# Patient Record
Sex: Female | Born: 1950 | Race: White | Hispanic: No | Marital: Married | Smoking: Never smoker
Health system: Southern US, Community
[De-identification: ages and names within clinical notes are randomized; demographics above are authoritative.]

## PROBLEM LIST (undated history)

## (undated) DIAGNOSIS — I10 Essential (primary) hypertension: Secondary | ICD-10-CM

## (undated) DIAGNOSIS — J45909 Unspecified asthma, uncomplicated: Secondary | ICD-10-CM

## (undated) HISTORY — PX: LAMINECTOMY AND MICRODISCECTOMY LUMBAR SPINE: SHX1913

## (undated) HISTORY — PX: ABDOMINAL HYSTERECTOMY: SHX81

## (undated) HISTORY — PX: LAPAROSCOPY: SHX197

## (undated) HISTORY — PX: ANTERIOR FUSION CERVICAL SPINE: SUR626

## (undated) HISTORY — PX: CARPAL TUNNEL RELEASE: SHX101

## (undated) HISTORY — PX: ROTATOR CUFF REPAIR: SHX139

---

## 2013-09-14 ENCOUNTER — Ambulatory Visit: Payer: Self-pay | Admitting: Family Medicine

## 2014-10-04 ENCOUNTER — Other Ambulatory Visit: Payer: Self-pay | Admitting: Family Medicine

## 2014-10-04 DIAGNOSIS — Z1231 Encounter for screening mammogram for malignant neoplasm of breast: Secondary | ICD-10-CM

## 2014-11-07 ENCOUNTER — Ambulatory Visit
Admission: RE | Admit: 2014-11-07 | Discharge: 2014-11-07 | Disposition: A | Discharge status: Home / Self Care | Payer: BLUE CROSS/BLUE SHIELD | Source: Ambulatory Visit | Attending: Family Medicine | Admitting: Family Medicine

## 2014-11-07 DIAGNOSIS — R922 Inconclusive mammogram: Secondary | ICD-10-CM | POA: Insufficient documentation

## 2014-11-07 DIAGNOSIS — Z1231 Encounter for screening mammogram for malignant neoplasm of breast: Secondary | ICD-10-CM | POA: Insufficient documentation

## 2014-11-14 ENCOUNTER — Other Ambulatory Visit: Payer: Self-pay | Admitting: Family Medicine

## 2014-11-14 DIAGNOSIS — N6489 Other specified disorders of breast: Secondary | ICD-10-CM

## 2014-11-27 ENCOUNTER — Ambulatory Visit
Admission: RE | Admit: 2014-11-27 | Discharge: 2014-11-27 | Disposition: A | Discharge status: Home / Self Care | Payer: BLUE CROSS/BLUE SHIELD | Source: Ambulatory Visit | Attending: Family Medicine | Admitting: Family Medicine

## 2014-11-27 DIAGNOSIS — N63 Unspecified lump in breast: Secondary | ICD-10-CM | POA: Diagnosis not present

## 2014-11-27 DIAGNOSIS — N6489 Other specified disorders of breast: Secondary | ICD-10-CM

## 2015-06-16 ENCOUNTER — Other Ambulatory Visit: Payer: Self-pay | Admitting: Family Medicine

## 2015-06-16 DIAGNOSIS — R928 Other abnormal and inconclusive findings on diagnostic imaging of breast: Secondary | ICD-10-CM

## 2015-07-08 ENCOUNTER — Other Ambulatory Visit: Payer: Self-pay | Admitting: Family Medicine

## 2015-07-08 ENCOUNTER — Ambulatory Visit
Admission: RE | Admit: 2015-07-08 | Discharge: 2015-07-08 | Disposition: A | Discharge status: Home / Self Care | Payer: BLUE CROSS/BLUE SHIELD | Source: Ambulatory Visit | Attending: Family Medicine | Admitting: Family Medicine

## 2015-07-08 DIAGNOSIS — N63 Unspecified lump in breast: Secondary | ICD-10-CM | POA: Diagnosis not present

## 2015-07-08 DIAGNOSIS — R928 Other abnormal and inconclusive findings on diagnostic imaging of breast: Secondary | ICD-10-CM

## 2016-08-31 ENCOUNTER — Other Ambulatory Visit: Payer: Self-pay | Admitting: Family Medicine

## 2016-08-31 ENCOUNTER — Ambulatory Visit
Admission: RE | Admit: 2016-08-31 | Discharge: 2016-08-31 | Disposition: A | Discharge status: Home / Self Care | Payer: BLUE CROSS/BLUE SHIELD | Source: Ambulatory Visit | Attending: Family Medicine | Admitting: Family Medicine

## 2016-08-31 DIAGNOSIS — R1031 Right lower quadrant pain: Secondary | ICD-10-CM | POA: Insufficient documentation

## 2016-08-31 DIAGNOSIS — K760 Fatty (change of) liver, not elsewhere classified: Secondary | ICD-10-CM | POA: Insufficient documentation

## 2016-08-31 DIAGNOSIS — K5730 Diverticulosis of large intestine without perforation or abscess without bleeding: Secondary | ICD-10-CM | POA: Insufficient documentation

## 2016-08-31 HISTORY — DX: Essential (primary) hypertension: I10

## 2016-08-31 HISTORY — DX: Unspecified asthma, uncomplicated: J45.909

## 2016-08-31 LAB — POCT I-STAT CREATININE: Creatinine, Ser: 1.3 mg/dL — ABNORMAL HIGH (ref 0.44–1.00)

## 2016-08-31 MED ORDER — IOPAMIDOL (ISOVUE-300) INJECTION 61%
75.0000 mL | Freq: Once | INTRAVENOUS | Status: AC | PRN
  Administered 2016-08-31: 75 mL via INTRAVENOUS

## 2018-10-05 ENCOUNTER — Other Ambulatory Visit: Payer: Self-pay

## 2018-10-05 ENCOUNTER — Ambulatory Visit: Payer: BC Managed Care – PPO | Attending: Gastroenterology

## 2018-10-05 DIAGNOSIS — M62838 Other muscle spasm: Secondary | ICD-10-CM | POA: Insufficient documentation

## 2018-10-05 DIAGNOSIS — R293 Abnormal posture: Secondary | ICD-10-CM | POA: Insufficient documentation

## 2018-10-05 NOTE — Patient Instructions (Signed)
Stabilization: Diaphragmatic Breathing    Lie with knees bent, feet flat. Place one hand on stomach, other on chest. Breathe deeply through nose, lifting belly hand without any motion of hand on chest.  Do this fore at least 5 min. Per night before sleeping and as much as you can remember throughout the day.
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
## 2018-10-12 ENCOUNTER — Other Ambulatory Visit: Payer: Self-pay

## 2018-10-12 ENCOUNTER — Ambulatory Visit: Payer: BC Managed Care – PPO | Attending: Gastroenterology

## 2018-10-12 DIAGNOSIS — R293 Abnormal posture: Secondary | ICD-10-CM | POA: Insufficient documentation

## 2018-10-12 DIAGNOSIS — M62838 Other muscle spasm: Secondary | ICD-10-CM | POA: Diagnosis present
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
## 2018-10-12 NOTE — Patient Instructions (Signed)
3-Way Wall Stretches for Pelvic Floor Lengthening   Bring bottom close to the wall and gently press straighten knees to feel a stretch down the back of you thighs. Hold while taking 5 deep belly breaths and feeling the pelvic floor relax and lower on each inhale.    Let your legs fall to the side to feel a stretch on the inside of your thighs. If the stretch is too intense you can use a pillow to take some of the weight off by wedging it on the outside of your hips. Hold while taking 5 deep belly breaths and feeling the pelvic floor relax and lower on each inhale.    Slide feet down the wall and move hips slightly away from the wall and then let your knees fall to the sides so you feel a stretch on the inside of the thighs near your groin. Hold while taking 5 deep belly breaths and feeling the pelvic floor relax and lower on each inhale.     *Perform each stretch in sequence 3 times, once a day   As you start wearing your heel-lift only wear it for an hour the first day and increase by an hour each day so you can allow for the body to adapt to the change easily without much pain. If your pain increases by more than 1-2 points, back off slightly or slow down how quickly you increase your wear time. Once you reach a full day of wear, use it as much as possible forever, even in house-shoes or flip-flops if necessary to keep yourself from reverting to bad pelvic and spinal alignment and having symptoms return.    Adjust-a-lift heel lift Can be found at [email or web address]   When seated, you want to maintain pelvic neutral with the shoulders gently down and back and ears in line with your shoulders. A lumbar roll such as the one below or a home-made towel-roll can be used for this purpose. Even Olympic athletes can only maintain proper seated posture for about 10 minutes without support!    Pictured: The Original [employer] Lumbar Roll  Use a small foot-stool under your feet to bring your  knees up to hip-height and make sure your lumbar support hits the small of your back, have your bottom all the way back in the chair and lean back onto the backrest.

## 2018-10-19 ENCOUNTER — Ambulatory Visit: Payer: BC Managed Care – PPO

## 2018-10-19 ENCOUNTER — Other Ambulatory Visit: Payer: Self-pay

## 2018-10-19 DIAGNOSIS — R293 Abnormal posture: Secondary | ICD-10-CM

## 2018-10-19 DIAGNOSIS — M62838 Other muscle spasm: Secondary | ICD-10-CM

## 2018-10-19 NOTE — Patient Instructions (Addendum)
   When walking, make sure that you are spending the same amount of time on each leg (and 1, and 2, and 1, and 2) and that you are feeling yourself push off through your toe on both sides. Finally, make sure that your arms are swinging from the motion of your trunk.     Bring both knees up to your chest and then hold the one farthest from the edge of the table/bed and let the other relax toward the floor until stretch is felt through the front of the hip.  Hold for __5__ deep belly breaths. Relax. Repeat __2-3__ times per side.   Do this __1-2__ times per day.

## 2018-10-19 NOTE — Therapy (Signed)
[facility] [address] [place], [place], [address] Phone: [phone]   Fax:  [phone]  Physical Therapy Treatment  The patient has been informed of current processes in place at Outpatient Rehab to protect patients from Covid-19 exposure including social distancing, schedule modifications, and new cleaning procedures. After discussing their particular risk with a therapist based on the patient's personal risk factors, the patient has decided to proceed with in-person therapy.   Patient Details  Name: Mallory Weeks MRN: [id number] Date of Birth: 24-May-1950 No data recorded  Encounter Date: 10/19/2018  PT End of Session - 10/19/18 1355    Visit Number  3    Number of Visits  10    Date for PT Re-Evaluation  12/14/18    Authorization Type  BCBS    Authorization Time Period  through 12/14/2018    Authorization - Visit Number  3    Authorization - Number of Visits  10    PT Start Time  1135    PT Stop Time  1228    PT Time Calculation (min)  53 min    Activity Tolerance  Patient tolerated treatment well;No increased pain    Behavior During Therapy  WFL for tasks assessed/performed       Past Medical History:  Diagnosis Date  . Asthma   . Hypertension     Past Surgical History:  Procedure Laterality Date  . ABDOMINAL HYSTERECTOMY    . ANTERIOR FUSION CERVICAL SPINE    . CARPAL TUNNEL RELEASE Right   . LAMINECTOMY AND MICRODISCECTOMY LUMBAR SPINE    . LAPAROSCOPY Right    knee  . ROTATOR CUFF REPAIR Right     There were no vitals filed for this visit.     Pelvic Floor Physical Therapy Treatment Note  SCREENING  Changes in medications, allergies, or medical history?: none    SUBJECTIVE  Patient reports: Was able to use the urge suppression technique to allow her to wait long enough to get to a store to have a BM and upstairs to her own bathroom for UI many times. Has been doing well with her  stretches and is using her deep breathing more throughout the day as well as kegels.  "I really feel like this is helping"  Precautions:  none  Pain update:  Location of pain: vaginally with intercourse Current pain: 0/10  Max pain: 9/10 Least pain: 0/10 Nature of pain:Sharp and cramping  Patient Goals: Not to be incontinent   OBJECTIVE  Changes in: Posture/Observations:  RLE: 76cm, LLE: 75.5cm  Range of Motion/Flexibilty:  Decreased mobility through all sacral borders, L1-3.  Gait Assessment: Short strides on R>L, narrow BOS, trendelenburg on R, decreased arm-swing and trunk motion.   INTERVENTIONS THIS SESSION: Manual: Performed TP release to B psoas to decrease spasm and pain and allow for improved balance of musculature for improved function and decreased symptoms. Therex: Educated on supine hip-flexor stretch To maintain and improve muscle length and allow for improved balance of musculature for long-term symptom relief. Theract: Educated on gait mechanics and practiced taking longer strides and allowing for thoracic rotation and arm-swing as well as not allowing trendelenburg gait on R.  Total time: 53 min.                             PT Short Term Goals - 10/05/18 1605      PT SHORT TERM  GOAL #1   Title  Patient will demonstrate improved pelvic alignment and balance of musculature surrounding the pelvis to facilitate decreased PFM spasms and decrease pelvic pain.    Baseline  R up-slip and ? RLE long, spasms surrounding pelvis    Time  5    Period  Weeks    Status  New    Target Date  11/09/18      PT SHORT TERM GOAL #2   Title  Patient will report no episodes of UUI/SUI over the course of the prior two weeks to demonstrate improved functional ability.    Baseline  Pt. having UUI and SUI with sneezing > 3 times in a row.    Time  5    Period  Weeks    Status  New    Target Date  11/09/18      PT SHORT TERM GOAL #3   Title   Patient will demonstrate ability to perform self internal TP release in order to facilitate further PFM spasm reduction at home for faster resolution of symptoms    Baseline  Pt. History of pain with intercourse for 10 years suggests that Pt. has PFM spasms leading to PFM dysfunction.    Time  5    Period  Weeks    Status  New    Target Date  11/09/18        PT Long Term Goals - 10/12/18 1402      PT LONG TERM GOAL #1   Title  Patient will score at or below 21/61 on the FISI to demonstrate a clinically meaningful decrease in disability and distress due to pelvic floor dysfunction.    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    Baseline  Pt has had 2 instances of FI over the past two weeks, 3 over the past 6 weeks.    Time  10    Period  Weeks    Status  New    Target Date  12/14/18      PT LONG TERM GOAL #3   Title  Patient will be independent with HEP and stress-management routines in order to return to PLOF.    Baseline  Pt. lacks knowledge of exercises that can decrease her Sx. and is in a state of high stress, contributing to her symptoms.    Time  10    Period  Weeks    Status  New    Target Date  12/14/18            Plan - 10/19/18 1356    Clinical Impression Statement  Pt. has made great improvement with  use of urge-suppression technique for both bowel and bladder issues. She has been able to make it to a bathroom without leakage when having the urge each time though it sometimes takes multiple repetitions of the technique before she gets there. She responded well to all interventions today, demonstrating decreased spasms and improved hip EXT ROM as well as understanding and  correct performance of all education and exercises. Continue per POC.  Personal Factors and Comorbidities  Age;Comorbidity 3+;Time since onset of injury/illness/exacerbation    Comorbidities  abdominal hysterectomy, bladder sling, lumbar laminectomy, cervical fusion, R rotator cuff repair, hypertension and asthma    Examination-Activity Limitations  Continence    Examination-Participation Restrictions  Interpersonal Relationship;Community Activity;Shop    Stability/Clinical Decision Making  Unstable/Unpredictable    Rehab Potential  Good    PT Frequency  1x / week    PT Duration  --   10 weeks   PT Treatment/Interventions  ADLs/Self Care Home Management;Biofeedback;Traction;Moist Heat;Electrical Stimulation;Aquatic Therapy;Gait training;Therapeutic exercise;Therapeutic activities;Functional mobility training;Neuromuscular re-education;Patient/family education;Manual techniques;Scar mobilization;Passive range of motion;Dry needling;Taping;Spinal Manipulations;Joint Manipulations    PT Next Visit Plan  TP release (vs. DN Pt. not showing interest) for adductors. Assess internally when appropriate.    PT Home Exercise Plan  diaphragmatic breathing, 3-way wall stretch and seated posture, hip-flexor stretch, walking mechanics.    Consulted and Agree with Plan of Care  Patient       Patient will benefit from skilled therapeutic intervention in order to improve the following deficits and impairments:  Abnormal gait, Decreased mobility, Hypomobility, Increased muscle spasms, Decreased range of motion, Decreased scar mobility, Improper body mechanics, Obesity, Pain, Postural dysfunction, Impaired flexibility, Increased fascial restricitons, Decreased coordination, Decreased activity tolerance  Visit Diagnosis: 1. Abnormal posture   2. Other muscle spasm        Problem List There are no active problems to display for this patient.  Cleophus MoltKeeli T.  DPT, ATC Cleophus MoltKeeli T  10/19/2018, 2:02  PM  [facility] [address] [place], [place], [address] Phone: [phone]   Fax:  [phone]  Name: Mallory Weeks MRN: [id number] Date of Birth: 1951-01-31

## 2018-10-26 ENCOUNTER — Other Ambulatory Visit: Payer: Self-pay

## 2018-10-26 ENCOUNTER — Ambulatory Visit: Payer: BC Managed Care – PPO

## 2018-10-26 DIAGNOSIS — M62838 Other muscle spasm: Secondary | ICD-10-CM

## 2018-10-26 DIAGNOSIS — R293 Abnormal posture: Secondary | ICD-10-CM

## 2018-10-26 NOTE — Patient Instructions (Signed)
Hip Abduction (Standing)    Stand with support. Squeeze deep core and hold. Start with leg to the side/back on your toe and gently squeeze the outer hip to lift the toe off of the ground. Hold for 1 second then repeat. You should feel this in the outer hip, not the front, not your side,  And not your back. Repeat __10_ times. Do _3__ sets  a day.  HIP Extension - Standing    Stand with support. Squeeze deep core and hold. Start with leg to the back on your toe and gently squeeze the buttock to lift the toe off of the ground. Hold for 1 second then repeat. You should feel this in the outer hip, not the front, not your side,  And not your back. Repeat __10_ times. Do _3__ sets  a day.

## 2018-10-26 NOTE — Therapy (Signed)
[facility] [address] [place], [place], [address] Phone: [phone]   Fax:  [phone]  Physical Therapy Treatment  The patient has been informed of current processes in place at Outpatient Rehab to protect patients from Covid-19 exposure including social distancing, schedule modifications, and new cleaning procedures. After discussing their particular risk with a therapist based on the patient's personal risk factors, the patient has decided to proceed with in-person therapy.   Patient Details  Name: Mallory KlippelSandra Ann Weeks MRN: [id number] Date of Birth: 1950-12-10 No data recorded  Encounter Date: 10/26/2018  PT End of Session - 10/26/18 1017    Visit Number  4    Number of Visits  10    Date for PT Re-Evaluation  12/14/18    Authorization Type  BCBS    Authorization Time Period  through 12/14/2018    Authorization - Visit Number  4    Authorization - Number of Visits  10    PT Start Time  0906    PT Stop Time  1001    PT Time Calculation (min)  55 min    Activity Tolerance  Patient tolerated treatment well;No increased pain    Behavior During Therapy  WFL for tasks assessed/performed       Past Medical History:  Diagnosis Date  . Asthma   . Hypertension     Past Surgical History:  Procedure Laterality Date  . ABDOMINAL HYSTERECTOMY    . ANTERIOR FUSION CERVICAL SPINE    . CARPAL TUNNEL RELEASE Right   . LAMINECTOMY AND MICRODISCECTOMY LUMBAR SPINE    . LAPAROSCOPY Right    knee  . ROTATOR CUFF REPAIR Right     There were no vitals filed for this visit.    [facility] [address] [place], [place], [address] Phone: [phone]   Fax:  [phone]    Pelvic Floor Physical Therapy Treatment Note  SCREENING  Changes in medications, allergies, or medical history?: none    SUBJECTIVE  Patient reports: Is doing well, taking time to pay  attention on her morning walks, no accidents since last visit. Only pain is in R shoulder  Precautions:  none  Pain update:  Location of pain: vaginally with intercourse Current pain: 0/10  Max pain: 9/10 Least pain: 0/10 Nature of pain:Sharp and cramping  Patient Goals: Not to be incontinent   OBJECTIVE  Changes in:  Palpation:  TTP to B adductor brevis, radiates to R lower back.  Gait Assessment: Pt. Continues to have decreased stride length but shows ~ 50% improvement from prior session with gait.   INTERVENTIONS THIS SESSION: Manual: Performed TP release and STM to B adductor brevis to decrease spasm and pain and allow for improved balance of musculature for improved function and decreased symptoms. Therex: Educated on standing hip EXT and ABD to improve strength of muscles opposing tight musculature to allow reciprocal inhibition to improve balance of musculature surrounding the pelvis and improve overall posture for optimal musculature length-tension relationship and function. Dry-needle: Performed TPDN with .30x6990mm needle and standard approach as described below to decrease spasm and pain and allow for improved balance of musculature for improved function and decreased symptoms.  Total time: 55 min.              Trigger Point Dry Needling - 10/26/18 0001    Consent Given?  Yes    Education Handout Provided  No  Muscles Treated Lower Quadrant  Adductor longus/brevis/magnus    Dry Needling Comments  Left    Adductor Response  Twitch response elicited;Palpable increased muscle length             PT Short Term Goals - 10/05/18 1605      PT SHORT TERM GOAL #1   Title  Patient will demonstrate improved pelvic alignment and balance of musculature surrounding the pelvis to facilitate decreased PFM spasms and decrease pelvic pain.    Baseline  R up-slip and ? RLE long, spasms surrounding pelvis    Time  5    Period  Weeks    Status  New     Target Date  11/09/18      PT SHORT TERM GOAL #2   Title  Patient will report no episodes of UUI/SUI over the course of the prior two weeks to demonstrate improved functional ability.    Baseline  Pt. having UUI and SUI with sneezing > 3 times in a row.    Time  5    Period  Weeks    Status  New    Target Date  11/09/18      PT SHORT TERM GOAL #3   Title  Patient will demonstrate ability to perform self internal TP release in order to facilitate further PFM spasm reduction at home for faster resolution of symptoms    Baseline  Pt. History of pain with intercourse for 10 years suggests that Pt. has PFM spasms leading to PFM dysfunction.    Time  5    Period  Weeks    Status  New    Target Date  11/09/18        PT Long Term Goals - 10/12/18 1402      PT LONG TERM GOAL #1   Title  Patient will score at or below 21/61 on the FISI to demonstrate a clinically meaningful decrease in disability and distress due to pelvic floor dysfunction.    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    Baseline  Pt has had 2 instances of FI over the past two weeks, 3 over the past 6 weeks.    Time  10    Period  Weeks    Status  New    Target Date  12/14/18      PT LONG TERM GOAL #3   Title  Patient will be independent with HEP and stress-management routines in order to return to PLOF.    Baseline  Pt. lacks knowledge of exercises that can decrease her Sx. and is in a state of high stress, contributing to her symptoms.    Time  10    Period  Weeks    Status  New    Target Date  12/14/18            Plan - 10/26/18 1017    Clinical Impression Statement  Pt. Responded well to all interventions today,  demonstrating improved hip ER/ABD ROM in supine, decreased spasm and TTP at adductors, as well as understanding and correct performance  of all education and exercises provided today. They will continue to benefit from skilled physical therapy to work toward remaining goals and maximize function as well as decrease likelihood of symptom increase or recurrence.     Personal Factors and Comorbidities  Age;Comorbidity 3+;Time since onset of injury/illness/exacerbation    Comorbidities  abdominal hysterectomy, bladder sling, lumbar laminectomy, cervical fusion, R rotator cuff repair, hypertension and asthma    Examination-Activity Limitations  Continence    Examination-Participation Restrictions  Interpersonal Relationship;Community Activity;Shop    Stability/Clinical Decision Making  Unstable/Unpredictable    Rehab Potential  Good    PT Frequency  1x / week    PT Duration  --   10 weeks   PT Treatment/Interventions  ADLs/Self Care Home Management;Biofeedback;Traction;Moist Heat;Electrical Stimulation;Aquatic Therapy;Gait training;Therapeutic exercise;Therapeutic activities;Functional mobility training;Neuromuscular re-education;Patient/family education;Manual techniques;Scar mobilization;Passive range of motion;Dry needling;Taping;Spinal Manipulations;Joint Manipulations    PT Next Visit Plan  Assess internally when appropriate.    PT Home Exercise Plan  diaphragmatic breathing, 3-way wall stretch and seated posture, hip-flexor stretch, walking mechanics, butterfly stretch in supine? (pt. says she has handout) and standing hip EXXT and ABD    Consulted and Agree with Plan of Care  Patient       Patient will benefit from skilled therapeutic intervention in order to improve the following deficits and impairments:  Abnormal gait, Decreased mobility, Hypomobility, Increased muscle spasms, Decreased range of motion, Decreased scar mobility, Improper body mechanics, Obesity, Pain, Postural dysfunction,  Impaired flexibility, Increased fascial restricitons, Decreased coordination, Decreased activity tolerance  Visit Diagnosis: 1. Abnormal posture   2. Other muscle spasm        Problem List There are no active problems to display for this patient.  Willa Rough DPT, ATC Willa Rough 10/26/2018, 10:21 AM  [place] MAIN [facility] SERVICES [address] [place], [place], [address] Phone: [phone]   Fax:  [phone]  Name: Marya Lowden MRN: [id number] Date of Birth: 05-12-1950

## 2018-11-02 ENCOUNTER — Ambulatory Visit: Payer: BC Managed Care – PPO

## 2018-11-02 ENCOUNTER — Other Ambulatory Visit: Payer: Self-pay

## 2018-11-02 DIAGNOSIS — R293 Abnormal posture: Secondary | ICD-10-CM

## 2018-11-02 DIAGNOSIS — M62838 Other muscle spasm: Secondary | ICD-10-CM

## 2018-11-02 NOTE — Patient Instructions (Addendum)
Cat / Cow Flow    Breathe in, let belly relax down toward the floor and then breathe out, pulling the lower belly in toward the backbone.   Repeat this _10x3__ times _1-3__ times per day  Intimate Rose internal trigger point release tool [phone]  Dr. Kathline Magic Premium Prostate Massager   $19.99 (at [facility] for 29.99)   Self Internal Trigger Point Relief    1) Wash your hands and prop yourself up in a way where you can easily reach the vagina. You may wish to have a small hand-held mirror near by.  2) lubricate the tool and vaginal opening using a hypoallergenic lubricant such as "slippery-stuff".   3) Slowly and gently insert the tool into the vagina using deep breaths to allow relaxation of the muscles around the tool.  4) Avoiding the "12 o-clock" region near the urethra, gently use the handle of the tool like a lever to press the angled tip of the tool onto the wall of the pelvic floor.   5) Move the tool to different areas of the pelvic floor and feel for areas that are tender called "trigger points". When you find one hold the tool still, applying just enough pressure to elicit mild discomfort and take deep belly breaths until the discomfort subsides or decreases by at least 50%.   6) Repeat the process for any trigger points you find spending between 3-10 minutes on this per night until you do not find any more trigger points or you are told otherwise by your therapist..  Self External Trigger Point Relief    1) Wash your hands and prop yourself up in a way where you can easily reach the vagina. You may wish to have a small hand-held mirror near by.  2) Use the 2 middle fingers to put gentle pressure on the three external pelvic floor muscles and hold pressure and take deep breaths as you allow the tension to release and discomfort to dissipate   3) Repeat the process for any trigger points you find spending between 3-10 minutes on this every 1-2 days  until you do not find any more trigger points or you are told otherwise by your therapist.

## 2018-11-02 NOTE — Therapy (Signed)
[facility] MAIN [facility] SERVICES [address] [place], [place], [address] Phone: [phone]   Fax:  [phone]  Physical Therapy Treatment  The patient has been informed of current processes in place at Outpatient Rehab to protect patients from Covid-19 exposure including social distancing, schedule modifications, and new cleaning procedures. After discussing their particular risk with a therapist based on the patient's personal risk factors, the patient has decided to proceed with in-person therapy.   Patient Details  Name: Mallory Weeks MRN: [id number] Date of Birth: [birth year] No data recorded  Encounter Date: 11/02/2018  PT End of Session - 11/02/18 1026    Visit Number  5    Number of Visits  10    Date for PT Re-Evaluation  12/14/18    Authorization Type  [employer]    Authorization Time Period  through 12/14/2018    Authorization - Visit Number  5    Authorization - Number of Visits  10    PT Start Time  0907    PT Stop Time  1000    PT Time Calculation (min)  53 min    Activity Tolerance  Patient tolerated treatment well;No increased pain    Behavior During Therapy  WFL for tasks assessed/performed       Past Medical History:  Diagnosis Date  . Asthma   . Hypertension     Past Surgical History:  Procedure Laterality Date  . ABDOMINAL HYSTERECTOMY    . ANTERIOR FUSION CERVICAL SPINE    . CARPAL TUNNEL RELEASE Right   . LAMINECTOMY AND MICRODISCECTOMY LUMBAR SPINE    . LAPAROSCOPY Right    knee  . ROTATOR CUFF REPAIR Right     There were no vitals filed for this visit.    Pelvic Floor Physical Therapy Treatment Note  SCREENING  Changes in medications, allergies, or medical history?: none    SUBJECTIVE  Patient reports: She is taking longer strides when she walks, not getting tired out as fast, the urge to go to the bathroom is not as strong. No leakage over the past two weeks. Hates intercourse not just because of  pain but because of being hot when he is near her.  Precautions:  none  Pain update:  Location of pain: vaginally with intercourse Current pain: 0/10  Max pain: 9/10 Least pain: 0/10 Nature of pain:Sharp and cramping  Patient Goals: Not to be incontinent   OBJECTIVE  Changes in:  Abdominal:  Able to contract TA with min. VC,TC for TA in quad exercise  Pelvic Floor External Exam: Introitus Appears: elevated, accessory glutes,adductors Skin integrity: WNL Palpation: TTP throughout Cough: parodoxical Prolapse visible?: no Scar mobility: scarring internally with decreased mobility but not assessed externally  Internal Vaginal Exam: Strength (PERF): 4/5, 6 sec, 2 times Symmetry: greater scarring posteriorly on R Palpation: highly tender throughout Prolapse: none   Internal Rectal Exam: Not assessed Strength (PERF): Symmetry: Palpation: Prolapse:    Gait Assessment: Improved stride length by ~ 65%   INTERVENTIONS THIS SESSION: Manual: assessed PFM and performed TP release to R posterior PR to decrease spasm and help Pt. Understand how to perform self TP release to decrease spasm and pain and allow for improved balance of musculature for improved function and decreased symptoms. Therex: Educated on and practiced TA in quadruped to improve deep-core recruitment and coordination for long-term relief of Sx. Self-care: Educated on the importance of addressing her stress and anxiety for her overall wellness as well as  her pelvic health. Educated on where to find tool for self TP release and how the physical spasms she is having impacts her overall sex drive and enjoyment. Total time: 55 min.                           PT Short Term Goals - 10/05/18 1605      PT SHORT TERM GOAL #1   Title  Patient will demonstrate improved pelvic alignment and balance of musculature surrounding the pelvis to facilitate decreased PFM spasms and decrease  pelvic pain.    Baseline  R up-slip and ? RLE long, spasms surrounding pelvis    Time  5    Period  Weeks    Status  New    Target Date  11/09/18      PT SHORT TERM GOAL #2   Title  Patient will report no episodes of UUI/SUI over the course of the prior two weeks to demonstrate improved functional ability.    Baseline  Pt. having UUI and SUI with sneezing > 3 times in a row.    Time  5    Period  Weeks    Status  New    Target Date  11/09/18      PT SHORT TERM GOAL #3   Title  Patient will demonstrate ability to perform self internal TP release in order to facilitate further PFM spasm reduction at home for faster resolution of symptoms    Baseline  Pt. History of pain with intercourse for 10 years suggests that Pt. has PFM spasms leading to PFM dysfunction.    Time  5    Period  Weeks    Status  New    Target Date  11/09/18        PT Long Term Goals - 10/12/18 1402      PT LONG TERM GOAL #1   Title  Patient will score at or below 21/61 on the FISI to demonstrate a clinically meaningful decrease in disability and distress due to pelvic floor dysfunction.    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    Baseline  Pt has had 2 instances of FI over the past two weeks, 3 over the past 6 weeks.    Time  10    Period  Weeks    Status  New    Target Date  12/14/18      PT LONG TERM GOAL #3   Title  Patient will be independent with HEP and stress-management routines in order to return to PLOF.    Baseline  Pt. lacks knowledge of exercises that can decrease her Sx. and is in a state of high stress, contributing to her symptoms.    Time  10    Period  Weeks    Status  New    Target Date   12/14/18            Plan - 11/02/18 1027    Clinical Impression Statement  Pt. Responded well to all interventions today, demonstrating  decreased resting PFM tension, improved ROM and decreased spasm as well as understanding and correct performance of all education and exercises provided today. They will continue to benefit from skilled physical therapy to work toward remaining goals and maximize function as well as decrease likelihood of symptom increase or recurrence.     Personal Factors and Comorbidities  Age;Comorbidity 3+;Time since onset of injury/illness/exacerbation    Comorbidities  abdominal hysterectomy, bladder sling, lumbar laminectomy, cervical fusion, R rotator cuff repair, hypertension and asthma    Examination-Activity Limitations  Continence    Examination-Participation Restrictions  Interpersonal Relationship;Community Activity;Shop    Stability/Clinical Decision Making  Unstable/Unpredictable    Rehab Potential  Good    PT Frequency  1x / week    PT Duration  --   10 weeks   PT Treatment/Interventions  ADLs/Self Care Home Management;Biofeedback;Traction;Moist Heat;Electrical Stimulation;Aquatic Therapy;Gait training;Therapeutic exercise;Therapeutic activities;Functional mobility training;Neuromuscular re-education;Patient/family education;Manual techniques;Scar mobilization;Passive range of motion;Dry needling;Taping;Spinal Manipulations;Joint Manipulations    PT Next Visit Plan  Perform internal or external TP release/scar release or teach Pt. how to do self release with tool PRN, give bow-and-arrow rotations and    PT Home Exercise Plan  diaphragmatic breathing, 3-way wall stretch and seated posture, hip-flexor stretch, walking mechanics, butterfly stretch in supine? (pt. says she has handout) and standing hip EXXT and ABD    Consulted and Agree with Plan of Care  Patient       Patient will benefit from skilled therapeutic intervention in order to improve the  following deficits and impairments:  Abnormal gait, Decreased mobility, Hypomobility, Increased muscle spasms, Decreased range of motion, Decreased scar mobility, Improper body mechanics, Obesity, Pain, Postural dysfunction, Impaired flexibility, Increased fascial restricitons, Decreased coordination, Decreased activity tolerance  Visit Diagnosis: Abnormal posture  Other muscle spasm     Problem List There are no active problems to display for this patient.  Cleophus MoltKeeli T.  DPT, ATC Cleophus MoltKeeli T  11/02/2018, 10:30 AM   [facility] [address] [place], [place], [address] Phone: [phone]   Fax:  [phone]  Name: Mallory Weeks MRN: [id number] Date of Birth: 27-Apr-1950

## 2018-11-09 ENCOUNTER — Ambulatory Visit: Payer: BC Managed Care – PPO | Attending: Gastroenterology

## 2018-11-09 ENCOUNTER — Other Ambulatory Visit: Payer: Self-pay

## 2018-11-09 DIAGNOSIS — M62838 Other muscle spasm: Secondary | ICD-10-CM | POA: Diagnosis present

## 2018-11-09 DIAGNOSIS — R293 Abnormal posture: Secondary | ICD-10-CM | POA: Insufficient documentation

## 2018-11-09 NOTE — Therapy (Signed)
[place] [facility] [address] [place], [place], [address] Phone: [phone]   Fax:  [phone]  Physical Therapy Treatment  The patient has been informed of current processes in place at Outpatient Rehab to protect patients from Covid-19 exposure including social distancing, schedule modifications, and new cleaning procedures. After discussing their particular risk with a therapist based on the patient's personal risk factors, the patient has decided to proceed with in-person therapy.   Patient Details  Name: Mallory KlippelSandra Ann Weeks MRN: [id number] Date of Birth: 1950-10-02 No data recorded  Encounter Date: 11/09/2018  PT End of Session - 11/09/18 1507    Visit Number  6    Number of Visits  10    Date for PT Re-Evaluation  12/14/18    Authorization Type  [employer]    Authorization Time Period  through 12/14/2018    Authorization - Visit Number  6    Authorization - Number of Visits  10    PT Start Time  1137    PT Stop Time  1230    PT Time Calculation (min)  53 min    Activity Tolerance  Patient tolerated treatment well;No increased pain    Behavior During Therapy  WFL for tasks assessed/performed       Past Medical History:  Diagnosis Date  . Asthma   . Hypertension     Past Surgical History:  Procedure Laterality Date  . ABDOMINAL HYSTERECTOMY    . ANTERIOR FUSION CERVICAL SPINE    . CARPAL TUNNEL RELEASE Right   . LAMINECTOMY AND MICRODISCECTOMY LUMBAR SPINE    . LAPAROSCOPY Right    knee  . ROTATOR CUFF REPAIR Right     There were no vitals filed for this visit.    Pelvic Floor Physical Therapy Treatment Note  SCREENING  Changes in medications, allergies, or medical history?: none    SUBJECTIVE  Patient reports: Woke up on Friday with R knee pain which worsened when she took her walk, cutting it short. 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 Still doing well with bowel/bladder Sx. Was going to try  intercourse but Partner lost interest when she said they would have to be careful for her knee.  Precautions:  none  Pain update:  Location of pain: vaginally with intercourse (R knee) Current pain: 0/10 (3) Max pain: 9/10 (8) Least pain: 0/10 (3) Nature of pain:Sharp and cramping (achy, stiff, throbbing)  Patient Goals: Not to be incontinent   OBJECTIVE  Changes in:  Observation/posture: R up-slip  Palpation: TTP to R gracilis and semimembranosus with pain referring to medial tibial flare.    INTERVENTIONS THIS SESSION: Manual: Performed TP release to R gracilis and semimembranosus followed by r up-slip correction to decrease spasm and mal-alignment and allow for Pt. To return to walking program with improved gait pattern and decreased pain. Therex: educated on and practiced side-stretch To maintain and improve muscle length and allow for improved balance of musculature for long-term symptom relief.   Total time: 53 min.                             PT Short Term Goals - 10/05/18 1605      PT SHORT TERM GOAL #1   Title  Patient will demonstrate improved pelvic alignment and balance of musculature surrounding the pelvis to facilitate decreased PFM spasms and decrease pelvic pain.    Baseline  R up-slip and ? RLE long, spasms surrounding pelvis    Time  5    Period  Weeks    Status  New    Target Date  11/09/18      PT SHORT TERM GOAL #2   Title  Patient will report no episodes of UUI/SUI over the course of the prior two Weeks to demonstrate improved functional ability.    Baseline  Pt. having UUI and SUI with sneezing > 3 times in a row.    Time  5    Period  Weeks    Status  New    Target Date  11/09/18      PT SHORT TERM GOAL #3   Title  Patient will demonstrate ability to perform self internal TP release in order to facilitate further PFM spasm reduction at home for faster resolution of symptoms    Baseline  Pt. History of pain  with intercourse for 10 years suggests that Pt. has PFM spasms leading to PFM dysfunction.    Time  5    Period  Weeks    Status  New    Target Date  11/09/18        PT Long Term Goals - 10/12/18 1402      PT LONG TERM GOAL #1   Title  Patient will score at or below 21/61 on the FISI to demonstrate a clinically meaningful decrease in disability and distress due to pelvic floor dysfunction.    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    Baseline  Pt has had 2 instances of FI over the past two Weeks, 3 over the past 6 Weeks.    Time  10    Period  Weeks    Status  New    Target Date  12/14/18      PT LONG TERM GOAL #3   Title  Patient will be independent with HEP and stress-management routines in order to return to PLOF.    Baseline  Pt. lacks knowledge of exercises that can decrease her Sx. and is in a state of high stress, contributing to her symptoms.    Time  10    Period  Weeks    Status  New    Target Date  12/14/18            Plan - 11/09/18 1507    Clinical Impression Statement  Pt. Responded well to all interventions today, demonstrating improved pain from 3/10 to 0/10 and return to her normal gait mechanics as well as understanding and correct performance of all education and exercises provided today. They will continue to benefit from skilled physical therapy to work toward remaining goals and maximize function as well as decrease likelihood of symptom increase or recurrence.     Personal Factors and Comorbidities  Age;Comorbidity 3+;Time since onset of injury/illness/exacerbation    Comorbidities  abdominal hysterectomy, bladder sling, lumbar laminectomy, cervical fusion,  R rotator cuff repair, hypertension and asthma    Examination-Activity Limitations  Continence    Examination-Participation Restrictions  Interpersonal Relationship;Community Activity;Shop  Stability/Clinical Decision Making  Unstable/Unpredictable    Rehab Potential  Good    PT Frequency  1x / week    PT Duration  --   10 Weeks   PT Treatment/Interventions  ADLs/Self Care Home Management;Biofeedback;Traction;Moist Heat;Electrical Stimulation;Aquatic Therapy;Gait training;Therapeutic exercise;Therapeutic activities;Functional mobility training;Neuromuscular re-education;Patient/family education;Manual techniques;Scar mobilization;Passive range of motion;Dry needling;Taping;Spinal Manipulations;Joint Manipulations    PT Next Visit Plan  Perform internal or external TP release/scar release or teach Pt. how to do self release with tool PRN, give bow-and-arrow rotations and chin-tucks/scap retraction    PT Home Exercise Plan  diaphragmatic breathing, 3-way wall stretch and seated posture, hip-flexor stretch, walking mechanics, butterfly stretch in supine? (pt. says she has handout) and standing hip EXXT and ABD    Consulted and Agree with Plan of Care  Patient       Patient will benefit from skilled therapeutic intervention in order to improve the following deficits and impairments:  Abnormal gait, Decreased mobility, Hypomobility, Increased muscle spasms, Decreased range of motion, Decreased scar mobility, Improper body mechanics, Obesity, Pain, Postural dysfunction, Impaired flexibility, Increased fascial restricitons, Decreased coordination, Decreased activity tolerance  Visit Diagnosis: Abnormal posture  Other muscle spasm     Problem List There are no active problems to display for this patient.  Cleophus Molt DPT, ATC Cleophus Molt 11/09/2018, 3:09 PM  [place] [facility] MAIN [facility] SERVICES [address] [place], [place], [address] Phone:  [phone]   Fax:  [phone]  Name: Mallory Weeks MRN: [id number] Date of Birth: February 11, 1951

## 2018-11-09 NOTE — Patient Instructions (Addendum)
Int[employer] Rose internal trigger point release tool [id number]  Dr. Kathline Magic Premium Prostate Massager   [id number]   Hold for 30 seconds (5 deep breaths) and repeat 2-3 times on each side once a day

## 2018-11-16 ENCOUNTER — Other Ambulatory Visit: Payer: Self-pay

## 2018-11-16 ENCOUNTER — Ambulatory Visit: Payer: BC Managed Care – PPO

## 2018-11-16 DIAGNOSIS — M62838 Other muscle spasm: Secondary | ICD-10-CM

## 2018-11-16 DIAGNOSIS — R293 Abnormal posture: Secondary | ICD-10-CM | POA: Diagnosis not present

## 2018-11-16 NOTE — Therapy (Addendum)
[place] [facility] MAIN [facility] SERVICES [address] [place], [place], [address] Phone: [phone]   Fax:  [phone]  Physical Therapy Treatment  The patient has been informed of current processes in place at Outpatient Rehab to protect patients from Covid-19 exposure including social distancing, schedule modifications, and new cleaning procedures. After discussing their particular risk with a therapist based on the patient's personal risk factors, the patient has decided to proceed with in-person therapy.  Patient Details  Name: Mallory Weeks MRN: [id number] Date of Birth: April 26, 1950 No data recorded  Encounter Date: 11/16/2018  PT End of Session - 11/20/18 1010    Visit Number  7    Number of Visits  10    Date for PT Re-Evaluation  12/14/18    Authorization Type  BCBS    Authorization Time Period  through 12/14/2018    Authorization - Visit Number  7    Authorization - Number of Visits  10    PT Start Time  1130    PT Stop Time  1230    PT Time Calculation (min)  60 min    Activity Tolerance  Patient tolerated treatment well;No increased pain    Behavior During Therapy  WFL for tasks assessed/performed       Past Medical History:  Diagnosis Date  . Asthma   . Hypertension     Past Surgical History:  Procedure Laterality Date  . ABDOMINAL HYSTERECTOMY    . ANTERIOR FUSION CERVICAL SPINE    . CARPAL TUNNEL RELEASE Right   . LAMINECTOMY AND MICRODISCECTOMY LUMBAR SPINE    . LAPAROSCOPY Right    knee  . ROTATOR CUFF REPAIR Right     There were no vitals filed for this visit.   Pelvic Floor Physical Therapy Treatment Note  SCREENING  Changes in medications, allergies, or medical history?: none    SUBJECTIVE  Patient reports: Has R knee pain, had a bowel episode where she had minimal leakage with runny stool and while dealing with her two grandkids. Has been having R knee pain that feels like when she had to have a knee  arthroscopy before.  Precautions:  none  Pain update:  Location of pain: vaginally with intercourse (R knee) Current pain: 0/10 (6) Max pain: 9/10 (8) Least pain: 0/10 (0) Nature of pain:Sharp and cramping (achy, stiff, throbbing)  Patient Goals: Not to be incontinent   OBJECTIVE  Changes in:  Observation/posture: PSIS even but increased tension in R low back/hip  Palpation: TTP to R Piriformis, Glute med, L5 lumbar erector spinae and multifidus    INTERVENTIONS THIS SESSION: Manual: Performed grade 3-4 PA mobs to all sacral borders, performed TP release to R Piriformis, Glute med, L5 lumbar erector spinae and multifidus to decrease spasm and pain and allow for improved balance of musculature for improved function and decreased symptoms and to improve mobility of joint and surrounding connective tissue and decrease pressure on nerve roots for improved conductivity and function of down-stream tissues.  Therex: educated on and practiced supine lumbar rotations and lumbar spine stretch To maintain and improve muscle length and lumbar mobility and allow for improved balance of musculature for long-term symptom relief. .   Total time: 60 min.                            PT Short Term Goals - 10/05/18 1605      PT SHORT TERM GOAL #  1   Title  Patient will demonstrate improved pelvic alignment and balance of musculature surrounding the pelvis to facilitate decreased PFM spasms and decrease pelvic pain.    Baseline  R up-slip and ? RLE long, spasms surrounding pelvis    Time  5    Period  Weeks    Status  New    Target Date  11/09/18      PT SHORT TERM GOAL #2   Title  Patient will report no episodes of UUI/SUI over the course of the prior two weeks to demonstrate improved functional ability.    Baseline  Pt. having UUI and SUI with sneezing > 3 times in a row.    Time  5    Period  Weeks    Status  New    Target Date  11/09/18      PT SHORT  TERM GOAL #3   Title  Patient will demonstrate ability to perform self internal TP release in order to facilitate further PFM spasm reduction at home for faster resolution of symptoms    Baseline  Pt. History of pain with intercourse for 10 years suggests that Pt. has PFM spasms leading to PFM dysfunction.    Time  5    Period  Weeks    Status  New    Target Date  11/09/18        PT Long Term Goals - 10/12/18 1402      PT LONG TERM GOAL #1   Title  Patient will score at or below 21/61 on the FISI to demonstrate a clinically meaningful decrease in disability and distress due to pelvic floor dysfunction.    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    Baseline  Pt has had 2 instances of FI over the past two weeks, 3 over the past 6 weeks.    Time  10    Period  Weeks    Status  New    Target Date  12/14/18      PT LONG TERM GOAL #3   Title  Patient will be independent with HEP and stress-management routines in order to return to PLOF.    Baseline  Pt. lacks knowledge of exercises that can decrease her Sx. and is in a state of high stress, contributing to her symptoms.    Time  10    Period  Weeks    Status  New    Target Date  12/14/18            Plan - 11/20/18 1013    Clinical Impression Statement  Pt. Responded well to all interventions today, demonstrating improved gait and decreased knee pain as well as understanding and correct performance of all education and exercises provided today. They will continue to benefit from skilled physical therapy to work toward remaining goals and maximize function as well as decrease likelihood of symptom increase or recurrence.     Personal  Factors and Comorbidities  Age;Comorbidity 3+;Time since onset of injury/illness/exacerbation    Comorbidities  abdominal hysterectomy, bladder sling, lumbar laminectomy, cervical  fusion, R rotator cuff repair, hypertension and asthma    Examination-Activity Limitations  Continence    Examination-Participation Restrictions  Interpersonal Relationship;Community Activity;Shop    Stability/Clinical Decision Making  Unstable/Unpredictable    Rehab Potential  Good    PT Frequency  1x / week    PT Duration  --   10 weeks   PT Treatment/Interventions  ADLs/Self Care Home Management;Biofeedback;Traction;Moist Heat;Electrical Stimulation;Aquatic Therapy;Gait training;Therapeutic exercise;Therapeutic activities;Functional mobility training;Neuromuscular re-education;Patient/family education;Manual techniques;Scar mobilization;Passive range of motion;Dry needling;Taping;Spinal Manipulations;Joint Manipulations    PT Next Visit Plan  Perform internal or external TP release/scar release or teach Pt. how to do self release with tool PRN, give bow-and-arrow rotations and chin-tucks/scap retraction    PT Home Exercise Plan  diaphragmatic breathing, 3-way wall stretch and seated posture, hip-flexor stretch, walking mechanics, butterfly stretch in supine? (pt. says she has handout) and standing hip EXXT and ABD    Consulted and Agree with Plan of Care  Patient       Patient will benefit from skilled therapeutic intervention in order to improve the following deficits and impairments:  Abnormal gait, Decreased mobility, Hypomobility, Increased muscle spasms, Decreased range of motion, Decreased scar mobility, Improper body mechanics, Obesity, Pain, Postural dysfunction, Impaired flexibility, Increased fascial restricitons, Decreased coordination, Decreased activity tolerance  Visit Diagnosis: Abnormal posture  Other muscle spasm     Problem List There are no active problems to display for this  patient.  Cleophus MoltKeeli T.  DPT, ATC Cleophus MoltKeeli T  11/20/2018, 10:22 AM  [place] [facility] [address] [place], [place], [address] Phone: [phone]   Fax:  [phone]  Name: Tammi KlippelSandra Ann Naron MRN: [id number] Date of Birth: 1950/05/14

## 2018-11-16 NOTE — Patient Instructions (Signed)
Lumbar Rotation: Caudal - Bilateral, Advanced (Supine)    Feet and knees together, arms outstretched, bring knees toward chest. Rotate knees to left, turning head in opposite direction until stretch is felt for 1 second, gently rotate at a slow pace back and forth to each side 10x2 times before performing below stretch.   Lumbar Rotation Stretch    Lie on back with left knee drawn toward chest. Slowly bring bent leg across body until stretch is felt in lower back/hip area. Hold __30__ seconds (5 deep belly breaths). Repeat __2-3__ times per set. Do _1___ sets per session. Do _1-3___ sessions per day.

## 2018-11-23 ENCOUNTER — Ambulatory Visit: Payer: BC Managed Care – PPO

## 2018-11-23 ENCOUNTER — Other Ambulatory Visit: Payer: Self-pay

## 2018-11-23 DIAGNOSIS — M62838 Other muscle spasm: Secondary | ICD-10-CM

## 2018-11-23 DIAGNOSIS — R293 Abnormal posture: Secondary | ICD-10-CM

## 2018-11-23 NOTE — Patient Instructions (Addendum)
   Let the top arm rest on your side, and slide along the torso as you rotate. Breathe in as you come forward, out as you open up. Do 2x15 on each side.   Shoulder Retraction and Downward Rotation   Rotate the shoulder blades back and down as if you had to hold a pencil between them, holding for 1 full second each time. Repeat this _10x3_ times _1-3_ times per day.   Alternate between the above and below     Start with Shoulder Retraction and Downward Rotation pictures above, holding the position while pulling the chin straight back as if trying to make a "double chin".  Breathe in forward and breathe out as you pull back, repeating this _3x10__ times __1-3__ times per day.   When seated, you want to maintain pelvic neutral with the shoulders gently down and back and ears in line with your shoulders. A lumbar roll such as the one below or a home-made towel-roll can be used for this purpose. Even Olympic athletes can only maintain proper seated posture for about 10 minutes without support!    Pictured: The Original [employer] Lumbar Roll

## 2018-11-23 NOTE — Therapy (Signed)
[place] MAIN [facility] SERVICES [address] [place], [place], [address] Phone: [phone]   Fax:  [phone]  Physical Therapy Treatment  The patient has been informed of current processes in place at Outpatient Rehab to protect patients from Covid-19 exposure including social distancing, schedule modifications, and new cleaning procedures. After discussing their particular risk with a therapist based on the patient's personal risk factors, the patient has decided to proceed with in-person therapy.   Patient Details  Name: Idalis Hoelting MRN: [id number] Date of Birth: [birth year] No data recorded  Encounter Date: 11/23/2018  PT End of Session - 11/23/18 1502    Visit Number  8    Number of Visits  10    Date for PT Re-Evaluation  12/14/18    Authorization Type  BCBS    Authorization Time Period  through 12/14/2018    Authorization - Visit Number  8    Authorization - Number of Visits  10    PT Start Time  1093    PT Stop Time  1232    PT Time Calculation (min)  50 min    Activity Tolerance  Patient tolerated treatment well;No increased pain    Behavior During Therapy  WFL for tasks assessed/performed       Past Medical History:  Diagnosis Date  . Asthma   . Hypertension     Past Surgical History:  Procedure Laterality Date  . ABDOMINAL HYSTERECTOMY    . ANTERIOR FUSION CERVICAL SPINE    . CARPAL TUNNEL RELEASE Right   . LAMINECTOMY AND MICRODISCECTOMY LUMBAR SPINE    . LAPAROSCOPY Right    knee  . ROTATOR CUFF REPAIR Right     There were no vitals filed for this visit.    Pelvic Floor Physical Therapy Treatment Note  SCREENING  Changes in medications, allergies, or medical history?: none    SUBJECTIVE  Patient reports: Has had a couple incidents with some UUI, enough to wet pants but more successful than not.   Precautions:  none  Pain update:  Location of pain: vaginally with intercourse (R knee) Current  pain: 0/10 (3) Max pain: 9/10 (9) Least pain: 0/10 (0) Nature of pain:Sharp and cramping (achy, stiff, throbbing)  **0/10 following treatment.  Patient Goals: Not to be incontinent   OBJECTIVE  Changes in:  ROM/Mobility: ~ 40% reduced thoracolumbar rotation B with pain in the R SIJ/hip either way.  Improved by ~ 15% and no pain following treatment.  Palpation: TTP to B erector spinae and multifidus at ~T12    INTERVENTIONS THIS SESSION: Manual: Performed MWM and overpressure with diaphragmatic breathing into thoracolumbar rotation in side-lying followed by TP release B to multifidus and erector spinae at ~ T12 Therex: educated on and practiced bow-and-arrow rotations to improve mobility of joint and surrounding connective tissue and decrease pressure on nerve roots for improved conductivity and function of down-stream tissues.  Theract: Educated on ergonomics for her home office chair to allow for improved relaxation of lumbar extensors and decreased FHP for decreased pain in back and LE. Marland Kitchen   Total time: 50 min.                           PT Short Term Goals - 10/05/18 1605      PT SHORT TERM GOAL #1   Title  Patient will demonstrate improved pelvic alignment and balance of musculature surrounding the pelvis to facilitate decreased  PFM spasms and decrease pelvic pain.    Baseline  R up-slip and ? RLE long, spasms surrounding pelvis    Time  5    Period  Weeks    Status  New    Target Date  11/09/18      PT SHORT TERM GOAL #2   Title  Patient will report no episodes of UUI/SUI over the course of the prior two weeks to demonstrate improved functional ability.    Baseline  Pt. having UUI and SUI with sneezing > 3 times in a row.    Time  5    Period  Weeks    Status  New    Target Date  11/09/18      PT SHORT TERM GOAL #3   Title  Patient will demonstrate ability to perform self internal TP release in order to facilitate further PFM spasm  reduction at home for faster resolution of symptoms    Baseline  Pt. History of pain with intercourse for 10 years suggests that Pt. has PFM spasms leading to PFM dysfunction.    Time  5    Period  Weeks    Status  New    Target Date  11/09/18        PT Long Term Goals - 10/12/18 1402      PT LONG TERM GOAL #1   Title  Patient will score at or below 21/61 on the FISI to demonstrate a clinically meaningful decrease in disability and distress due to pelvic floor dysfunction.    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    Baseline  Pt has had 2 instances of FI over the past two weeks, 3 over the past 6 weeks.    Time  10    Period  Weeks    Status  New    Target Date  12/14/18      PT LONG TERM GOAL #3   Title  Patient will be independent with HEP and stress-management routines in order to return to PLOF.    Baseline  Pt. lacks knowledge of exercises that can decrease her Sx. and is in a state of high stress, contributing to her symptoms.    Time  10    Period  Weeks    Status  New    Target Date  12/14/18            Plan - 11/23/18 1432    Clinical Impression Statement  Pt. Responded well to all interventions today, demonstrating improved spinal mobility and thoracolumbar rotation ROM, decreased pain and spasm, as well as understanding and correct performance of all education and exercises provided today. They will continue to benefit from skilled physical therapy to work toward remaining goals and maximize function as well as decrease likelihood of symptom increase or recurrence.     PT Next Visit Plan  teach Pt. how to do self release with tool PRN, Thoracic mobility, reassess  hip-flexors, glute strengthening. or perform Internal TP releasePRN    PT Home Exercise Plan  diaphragmatic breathing, 3-way wall stretch  and seated posture, hip-flexor stretch, walking mechanics, butterfly stretch in supine? (pt. says she has handout) and standing hip EXT and ABD,    Recommended Other Services  --    Consulted and Agree with Plan of Care  Patient       Patient will benefit from skilled therapeutic intervention in order to improve the following deficits and impairments:     Visit Diagnosis: Abnormal posture  Other muscle spasm     Problem List There are no active problems to display for this patient.  Cleophus MoltKeeli T.  DPT, ATC Cleophus MoltKeeli T  11/23/2018, 3:16 PM  [place] [facility] [address] [place], [place], [address] Phone: [phone]   Fax:  [phone]  Name: Tammi KlippelSandra Ann Flight MRN: [id number] Date of Birth: Mar 08, 1951

## 2018-11-30 ENCOUNTER — Ambulatory Visit: Payer: BC Managed Care – PPO

## 2018-11-30 ENCOUNTER — Other Ambulatory Visit: Payer: Self-pay

## 2018-11-30 DIAGNOSIS — M62838 Other muscle spasm: Secondary | ICD-10-CM

## 2018-11-30 DIAGNOSIS — R293 Abnormal posture: Secondary | ICD-10-CM | POA: Diagnosis not present

## 2018-11-30 NOTE — Therapy (Signed)
[place] [facility] [address] [place], [place], [address] Phone: [phone]   Fax:  [phone]  Physical Therapy Treatment  The patient has been informed of current processes in place at Outpatient Rehab to protect patients from Covid-19 exposure including social distancing, schedule modifications, and new cleaning procedures. After discussing their particular risk with a therapist based on the patient's personal risk factors, the patient has decided to proceed with in-person therapy.  Patient Details  Name: Mallory KlippelSandra Ann Weeks MRN: [id number] Date of Birth: October 27, 1950 No data recorded  Encounter Date: 11/30/2018  PT End of Session - 11/30/18 1454    Visit Number  9    Number of Visits  10    Date for PT Re-Evaluation  12/14/18    Authorization Type  BCBS    Authorization Time Period  through 12/14/2018    Authorization - Visit Number  9    Authorization - Number of Visits  10    PT Start Time  1145    PT Stop Time  1230    PT Time Calculation (min)  45 min    Activity Tolerance  Patient tolerated treatment well;No increased pain    Behavior During Therapy  WFL for tasks assessed/performed       Past Medical History:  Diagnosis Date  . Asthma   . Hypertension     Past Surgical History:  Procedure Laterality Date  . ABDOMINAL HYSTERECTOMY    . ANTERIOR FUSION CERVICAL SPINE    . CARPAL TUNNEL RELEASE Right   . LAMINECTOMY AND MICRODISCECTOMY LUMBAR SPINE    . LAPAROSCOPY Right    knee  . ROTATOR CUFF REPAIR Right     There were no vitals filed for this visit.     Pelvic Floor Physical Therapy Treatment Note  SCREENING  Changes in medications, allergies, or medical history?: none    SUBJECTIVE  Patient reports: Has had success multiple times with Urge suppression and urge is not as strong. Has had "little tiny dribble but not much. Still having ain in the R knee but not as bad. Has not attempted  intercourse.   Precautions:  none  Pain update:  Location of pain: vaginally with intercourse (R knee) Current pain: 0/10 (4) Max pain: 9/10 (5) Least pain: 0/10 (0) Nature of pain:Sharp and cramping (achy, stiff, throbbing)  **0/10 following treatment.  Patient Goals: Not to be incontinent   OBJECTIVE  Changes in:  ROM/Mobility: Decreased hip EXT and discomfort with stretch B, R>L  Palpation: TTP to B Iliacus and Psoas, R QL, Erector spinae, Glute Med, and Piriformis   INTERVENTIONS THIS SESSION: Manual: Performed TP release B to Iliacus and R Psoas to decrease spasm and pain and allow for improved balance of musculature for improved function and decreased symptoms. Therex: educated on and practiced thoracic extension over a towel roll with arm motions only and reviewed hip-flexor stretch and added hold-release spasm reduction to allow for more effective lengthening of hip-flexor muscles and decreased pressure on the lumbar spine and nerves headed to the pelvis. Theract: Educated on and practiced how to perform self TP release to muscles in the low back and hip to allow for faster progress toward decreased pressure on nerves and decreased R LB and Knee pain as well as decreased SUI and urgency.   Total time: 45 min.  PT Short Term Goals - 10/05/18 1605      PT SHORT TERM GOAL #1   Title  Patient will demonstrate improved pelvic alignment and balance of musculature surrounding the pelvis to facilitate decreased PFM spasms and decrease pelvic pain.    Baseline  R up-slip and ? RLE long, spasms surrounding pelvis    Time  5    Period  Weeks    Status  New    Target Date  11/09/18      PT SHORT TERM GOAL #2   Title  Patient will report no episodes of UUI/SUI over the course of the prior two weeks to demonstrate improved functional ability.    Baseline  Pt. having UUI and SUI with sneezing > 3 times in a row.     Time  5    Period  Weeks    Status  New    Target Date  11/09/18      PT SHORT TERM GOAL #3   Title  Patient will demonstrate ability to perform self internal TP release in order to facilitate further PFM spasm reduction at home for faster resolution of symptoms    Baseline  Pt. History of pain with intercourse for 10 years suggests that Pt. has PFM spasms leading to PFM dysfunction.    Time  5    Period  Weeks    Status  New    Target Date  11/09/18        PT Long Term Goals - 10/12/18 1402      PT LONG TERM GOAL #1   Title  Patient will score at or below 21/61 on the FISI to demonstrate a clinically meaningful decrease in disability and distress due to pelvic floor dysfunction.    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    Baseline  Pt has had 2 instances of FI over the past two weeks, 3 over the past 6 weeks.    Time  10    Period  Weeks    Status  New    Target Date  12/14/18      PT LONG TERM GOAL #3   Title  Patient will be independent with HEP and stress-management routines in order to return to PLOF.    Baseline  Pt. lacks knowledge of exercises that can decrease her Sx. and is in a state of high stress, contributing to her symptoms.    Time  10    Period  Weeks    Status  New    Target Date  12/14/18            Plan - 11/30/18 1454    Clinical Impression Statement  Pt. was late to her appt. today but responded well to all interventions today, demonstrating decreased hip-flexor spasm and pain as well as understanding and correct performance of all education and exercises provided today. They will continue to benefit from skilled physical therapy to work toward  remaining goals and maximize function as well as decrease likelihood of symptom increase or recurrence.     PT  Next Visit Plan  teach Pt. how to do self release with tool PRN, Thoracic mobility, reassess hip-flexors, glute strengthening.    PT Home Exercise Plan  diaphragmatic breathing, 3-way wall stretch and seated posture, hip-flexor stretch, walking mechanics, butterfly stretch in supine? (pt. says she has handout) and standing hip EXT and ABD, thoracic EXT over towel roll, self TP release with tennis ball    Consulted and Agree with Plan of Care  Patient       Patient will benefit from skilled therapeutic intervention in order to improve the following deficits and impairments:     Visit Diagnosis: Abnormal posture  Other muscle spasm     Problem List There are no active problems to display for this patient.  Cleophus Molt DPT, ATC Cleophus Molt 11/30/2018, 3:05 PM  [place] [facility] MAIN [facility] SERVICES [address] [place], [place], [address] Phone: [phone]   Fax:  [phone]  Name: Buffi Ewton MRN: [id number] Date of Birth: February 14, 1951

## 2018-11-30 NOTE — Patient Instructions (Signed)
   Place foam roller or towel under your upper back between your shoulder blades. Support your head with your hands, elbows forward, and gently rock back and forth and side to side to improve motion in your back.   Move the foam roller or towel up and down to a few spots in the upper back, repeating the process.      This is The QL muscle  To perform release on this muscle, start by getting into this position by bridging the hips up and then slowly lowering your back, then your butt down to lengthen the low back then put the ball under you where you feel the tender spot and roll to the same side slightly to add pressure as needed. Hold still and take deep breaths until the pain is at least 50% less or, ideally, just pressure.   This is your piriformis    To release this muscle start in this position with your ankle crossed over the opposite knee. Place the tennis ball under your buttock where the tender spot is and then slightly roll your weight to the same side to put just enough pressure that it is uncomfortable. Hold and take deep breaths until the pain is at least 50% less or, ideally ,just pressure.   This is your Gluteus Medius and Minimus  To perform release on this muscle, start by getting into this position by bridging the hips up and then slowly lowering your back, then your butt down to lengthen the low back then put the ball under you where you feel the tender spot and roll to the same side slightly to add pressure as needed. Hold still and take deep breaths until the pain is at least 50% less or, ideally, just pressure.   These are your deep hip-flexor muscles. They are easiest to reach where they come together at the hip.   To perform release on this muscle, start by getting into this position by laying on your stomach then put the ball under you where you feel the tender spot and bring the opposite knee up/out to the side to add pressure as needed. Hold still and take deep  breaths until the pain is at least 50% less or, ideally ,just pressure.       Bring both knees up to your chest and then hold the one farthest from the edge of the table/bed and let the other relax toward the floor until stretch is felt through the front of the hip.   Hold-relax: Gently lift the knee of the down leg up ~ 1/2 and inch and hold for 5 seconds, then let it relax all the way for a second and repeat 4 more times to decrease resting tension in the muscle.   Stretch: Let the leg relax and feel a stretch across the front of the hip/thigh as you take belly breaths. Hold for __5__ deep belly breaths. Relax. Repeat __2-3__ times per side.    Do this __1-2__ times per day.

## 2018-12-07 ENCOUNTER — Ambulatory Visit: Payer: BC Managed Care – PPO

## 2018-12-14 ENCOUNTER — Ambulatory Visit: Payer: BC Managed Care – PPO

## 2019-01-03 ENCOUNTER — Ambulatory Visit: Payer: BC Managed Care – PPO | Attending: Gastroenterology

## 2019-01-03 ENCOUNTER — Other Ambulatory Visit: Payer: Self-pay

## 2019-01-03 DIAGNOSIS — M62838 Other muscle spasm: Secondary | ICD-10-CM | POA: Insufficient documentation

## 2019-01-03 DIAGNOSIS — R293 Abnormal posture: Secondary | ICD-10-CM | POA: Insufficient documentation

## 2019-01-03 NOTE — Therapy (Signed)
[place] [facility] SERVICES [address] [place], [place], [address] Phone: [phone]   Fax:  [phone]  Physical Therapy Treatment and Discharge Summary The patient has been informed of current processes in place at Outpatient Rehab to protect patients from Covid-19 exposure including social distancing, schedule modifications, and new cleaning procedures. After discussing their particular risk with a therapist based on the patient's personal risk factors, the patient has decided to proceed with in-person therapy.  Patient Details  Name: Mallory Weeks MRN: [id number] Date of Birth: [birth year] No data recorded  Encounter Date: 01/03/2019  PT End of Session - 01/03/19 1210    Visit Number  10    Number of Visits  10    Date for PT Re-Evaluation  12/14/18    Authorization Type  [employer]    Authorization Time Period  through 12/14/2018    Authorization - Visit Number  10    Authorization - Number of Visits  10    PT Start Time  1140    PT Stop Time  1230    PT Time Calculation (min)  50 min    Activity Tolerance  Patient tolerated treatment well;No increased pain    Behavior During Therapy  WFL for tasks assessed/performed       Past Medical History:  Diagnosis Date  . Asthma   . Hypertension     Past Surgical History:  Procedure Laterality Date  . ABDOMINAL HYSTERECTOMY    . ANTERIOR FUSION CERVICAL SPINE    . CARPAL TUNNEL RELEASE Right   . LAMINECTOMY AND MICRODISCECTOMY LUMBAR SPINE    . LAPAROSCOPY Right    knee  . ROTATOR CUFF REPAIR Right     There were no vitals filed for this visit.    Pelvic Floor Physical Therapy Treatment Note and Discharge Summary  SCREENING  Changes in medications, allergies, or medical history?: none    SUBJECTIVE  Patient reports: Patient reports they've been sick and therefore had to miss PT. Overall, the patient has been feeling "ok" and has not been performing exercises as they  should. Patient reports poor sleep habits- working until midnight every night. High stress at work.    No incontinence as of late. Has been able to be successful with urge suppression techniques. Strong urge to pee still present but no leakage with techniques. Bowel movements have not been an issue.   Reports "lots of low back and hip pain". Using a SPC with long walks/activities with grandchildren due to R knee pain. Had a hard time picking up 50 year old grandchild - causes lots of low back pain after. (most recent episode was about 2 weeks ago) Pain still present now.   Patient reports she has made at least 80% improvement since starting PT.   Precautions:  none  Pain update:  Location of pain: LBP  Current pain: 6/10  Max pain: 9/10  Least pain: 0/10  Nature of pain:(achy, stiff, throbbing)  **0/10 following treatment.  Patient Goals: Not to be incontinent   OBJECTIVE  Changes in:  ROM/Mobility: Decreased posterior chain (LE).   Palpation: TTP to R QL.    INTERVENTIONS THIS SESSION: Self Care: reiteration from PT to counseling is important for patient as it impacts prognosis for patient in PT. Progressive relaxation information/handout given. (15 minutes)   ThereAct: Pain management strategies and HEP review (progression/regression). Gave patient butterfly stretch, QL stretch, hamstring stretch and childs pose. Planning for doing QL stretch and hamstring  stretch for patient to perform while at work to manage symptoms. (15 minutes)    Manual: TTP release at R QL for improved QL isolation for correction. Up-slip correction to correct for pelvic alignment improvement. Noted at baseline: R hip up-slip, L slightly longer in supine. (20 minutes)    Total time: 50 min.                                     PT Short Term Goals - 01/03/19 1157      PT SHORT TERM GOAL #1   Title  Patient will demonstrate improved pelvic alignment and  balance of musculature surrounding the pelvis to facilitate decreased PFM spasms and decrease pelvic pain.    Baseline  R up-slip and ? RLE long, spasms surrounding pelvis; 10-28 R upslip present at [facility], improved at [facility].bIT has been corrected previouly but poor HEP compliance led to recurrence.    Time  5    Period  Weeks    Status  Partially Met    Target Date  02/07/19      PT SHORT TERM GOAL #2   Title  Patient will report no episodes of UUI/SUI over the course of the prior two weeks to demonstrate improved functional ability.    Baseline  Pt. having UUI and SUI with sneezing > 3 times in a row. 10-28 reports no incontience over last few weeks.    Time  5    Period  Weeks    Status  Achieved    Target Date  11/09/18      PT SHORT TERM GOAL #3   Title  Patient will demonstrate ability to perform self internal TP release in order to facilitate further PFM spasm reduction at home for faster resolution of symptoms    Baseline  Pt. History of pain with intercourse for 10 years suggests that Pt. has PFM spasms leading to PFM dysfunction. 10-28:Pt. has been educated on how to perform self TP release but has not attempted, states she would not like to particpate in intercourse due to fear of pain.    Time  5    Period  Weeks    Status  Achieved    Target Date  02/07/19        PT Long Term Goals - 01/03/19 1224      PT LONG TERM GOAL #1   Title  Patient will score at or below 21/61 on the FISI to demonstrate a clinically meaningful decrease in disability and distress due to pelvic floor dysfunction.    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    Baseline  Pt has had 2  instances of FI over the past two weeks, 3 over the past 6 weeks. 10-28 No longer experiencing FI    Time  10    Period  Weeks    Status  Achieved    Target Date  12/14/18      PT LONG TERM GOAL #3  Title  Patient will be independent with HEP and stress-management routines in order to return to PLOF.    Baseline  Pt. lacks knowledge of exercises that can decrease her Sx. and is in a state of high stress, contributing to her symptoms. 10-28 Decreased progresse since new stress, had made good progress previously and feels more confident at this time.    Time  10    Period  Weeks    Status  Partially Met    Target Date  03/14/19            Plan - 01/03/19 1211    Clinical Impression Statement  Patient has made moderate progress since attending pelivc health phsyical therapy. She has not expereinced urinary or fecal incotneince in the last few weeks, however still has difficulty holding back gas. Patietn has made progress in stress management and HEP compliace prior to having increased workload in the last 2 months, contribuitng to patients prognosis. Patient would like to self discharge at this time due to work load stress and having achieved some goals. PT provided patient education for patient to have pain management strategies. Patient would continue to benefit from skilled pelvic health PT to address LBP, pelvic muscle spasms and patient education to reduce recurrance of symptoms or injury.    Personal Factors and Comorbidities  Age;Comorbidity 3+;Time since onset of injury/illness/exacerbation    Comorbidities  abdominal hysterectomy, bladder sling, lumbar laminectomy, cervical fusion, R rotator cuff repair, hypertension and asthma    Examination-Activity Limitations  Continence    Examination-Participation Restrictions  Interpersonal Relationship;Community Activity;Shop    Rehab Potential  Good    PT Frequency  1x / week    PT Treatment/Interventions  ADLs/Self Care Home  Management;Biofeedback;Traction;Moist Heat;Electrical Stimulation;Aquatic Therapy;Gait training;Therapeutic exercise;Therapeutic activities;Functional mobility training;Neuromuscular re-education;Patient/family education;Manual techniques;Scar mobilization;Passive range of motion;Dry needling;Taping;Spinal Manipulations;Joint Manipulations    PT Next Visit Plan  D/C    PT Home Exercise Plan  diaphragmatic breathing, 3-way wall stretch and seated posture- adjusted for at work treatment, hip-flexor stretch, walking mechanics, butterfly stretch in supine? (pt. says she has handout) and standing hip EXT and ABD, thoracic EXT over towel roll, self TP release with tennis ball    Recommended Other Services  counseling services for stress and pain management    Consulted and Agree with Plan of Care  Patient       Patient will benefit from skilled therapeutic intervention in order to improve the following deficits and impairments:  Abnormal gait, Decreased mobility, Hypomobility, Increased muscle spasms, Decreased range of motion, Decreased scar mobility, Improper body mechanics, Obesity, Pain, Postural dysfunction, Impaired flexibility, Increased fascial restricitons, Decreased coordination, Decreased activity tolerance  Visit Diagnosis: Other muscle spasm  Abnormal posture     Problem List There are no active problems to display for this patient.   Tomasita Morrow, SPT  01/04/2019, 9:10 AM  [place] [facility]IN [facility] SERVICES [address] [place], [place], [address] Phone: [phone]   Fax:  [phone]  Name: Mallory Weeks MRN: [id number] Date of Birth: [birth year]

## 2019-01-03 NOTE — Patient Instructions (Addendum)
   1. Butterfly: Bring feet together and let the knees fall out to the sides. Hold for 5 deep breaths, rest then repeat 2 more times.   - can perform in bed. Can place a small towel under your tailbone/sacrum to assist with reducing low back pain  - should feel a stretch in middle of thigh. If stretch is too intense, prop pillows under knee.    2. Child's Pose Pelvic Floor Lengthening    Sit in knee-chest position and reach arms forward. Separate knees for comfort. Hold position for _5__ breaths. Repeat _2-3__ times. Do _1-2__ times per day.   3. Seated Hamstring Stretch-      4. Hamstring stretch: Perform at the edge of stable chair (not on wheels). Bend on foot, and stick one leg straight out, toes up towards your nose. Lean forward from your hips till you feel a stretch in the back of your outstretch leg. Hold for 10 deep/belly breaths. Perform on each leg, and repeat twice.  -- good to perform while at work throughout the day.     5. Side Stretch: Hold for 30 seconds (5 deep breaths) and repeat 2-3 times on each side once a day -- good to perform while working throughout the day.   6. Self care: Progressive relaxation technique:  Start by tensing all the muscles in your right foot, then calf, then upper leg, glute, low back and hold for 2 deep breaths, release the tension and let the limb melt into the table. Next start with the left leg and do the same progression, foot, lower leg, upper leg, glute, low back, then release. Then the Right hand make a fist, tense the forearm, the upper arm, shoulder, hold for two breaths, and release letting it melt into the ground, repeat on the left. Finally, start with both feet, both calves, both upper legs, both glutes, lower abdomen, upper abdomen, make fists, tighten forearms, upper arms, shoulders, face. Hold for two breaths and then completely melt into the floor.

## 2019-09-03 ENCOUNTER — Ambulatory Visit: Payer: BC Managed Care – PPO | Attending: Gastroenterology

## 2019-09-03 ENCOUNTER — Other Ambulatory Visit: Payer: Self-pay

## 2019-09-03 DIAGNOSIS — R293 Abnormal posture: Secondary | ICD-10-CM | POA: Diagnosis not present

## 2019-09-03 DIAGNOSIS — M62838 Other muscle spasm: Secondary | ICD-10-CM | POA: Diagnosis present

## 2019-09-03 DIAGNOSIS — M533 Sacrococcygeal disorders, not elsewhere classified: Secondary | ICD-10-CM

## 2019-09-03 NOTE — Therapy (Signed)
[facility] MAIN [facility] SERVICES [address] [place], [place], [address] Phone: [phone]   Fax:  [phone]  Physical Therapy Evaluation  The patient has been informed of current processes in place at Outpatient Rehab to protect patients from Covid-19 exposure including social distancing, schedule modifications, and new cleaning procedures. After discussing their particular risk with a therapist based on the patient's personal risk factors, the patient has decided to proceed with in-person therapy.  Patient Details  Name: Mallory Weeks MRN: [id number] Date of Birth: 1950/12/22 No data recorded  Encounter Date: 09/03/2019   PT End of Session - 09/03/19 0753    Visit Number 1    Number of Visits 10    Date for PT Re-Evaluation 11/12/19    Authorization Type BCBS    Authorization Time Period 09/03/19 through 11/12/19    Authorization - Visit Number 1    Authorization - Number of Visits 10    PT Start Time 0740    PT Stop Time 0830    PT Time Calculation (min) 50 min    Activity Tolerance Patient tolerated treatment well;No increased pain    Behavior During Therapy WFL for tasks assessed/performed           Past Medical History:  Diagnosis Date  . Asthma   . Hypertension     Past Surgical History:  Procedure Laterality Date  . ABDOMINAL HYSTERECTOMY    . ANTERIOR FUSION CERVICAL SPINE    . CARPAL TUNNEL RELEASE Right   . LAMINECTOMY AND MICRODISCECTOMY LUMBAR SPINE    . LAPAROSCOPY Right    knee  . ROTATOR CUFF REPAIR Right     There were no vitals filed for this visit.   Pelvic Floor Physical Therapy Evaluation and Assessment  SCREENING  Falls in last 6 mo: no  Patient's communication preference:   Red Flags:  Have you had any night sweats? no Unexplained weight loss? no Saddle anesthesia? no Unexplained changes in bowel or bladder habits? yes  SUBJECTIVE  Patient reports: Her "back wen out" recently and she  was in excruciating pain for days. She is incontinent again.  Precautions:  Asthma, HTN  Social/Family/Vocational History:   Working from home, Visual merchandiser, telehealth. Has 18 months until she can retire. She didn't do her exercises after leaving PT. Started having Sx. Again ~ 2 months ago. Had 2 episodes of diverticulitis in May and really bad back pain around the same time. The back and hip pain has been pretty chronic. She can only stand for ~ 10 min. At a time to do dishes, etc. Having a hard time keeping the house up.   Recent Procedures/Tests/Findings:  none  Obstetrical History: Yes, 1 with episiotomy  Gynecological History: Had fibroids which led to total hysterectomy with bladder sling using cadaver graft.  Urinary History: Has SUI with "more than 3 sneezes" will get her a little bit, has had occasional total UI with urge ~ 2x/month.   Gastrointestinal History: Has had FI episodes ~ 2x/month, has strong urge and is running at home. Having loose stool. Varies day to day, not as loose as it had been in the past.  Sexual activity/pain: Yes, ~10 years, has avoided sex for a long time.  Location of pain: vaginally with intercourse Current pain:  0/10  Max pain:  9/10 Least pain:  0/10 Nature of pain: Sharp and cramping  Location of pain: Low back and hip Current pain:  2/10  Max pain:  8/10 Least pain:  1/10 Nature of pain: Spasm/achy  Patient Goals: Be able to take the grandkids to the science center without fear of incontinence. To be able to stand long enough to do the dishes and clean up the house without intense pain (~ 4/10).     OBJECTIVE  Posture/Observations:  Sitting:  Standing: L PSIS high, Lordotic, R foot ER Supine: R ASIS high  Palpation/Segmental Motion/Joint Play: TTP to R>L pectineus, psoas, QL, cervical extensors L>R Iliacus, hamstrings.   Decreased mobility and pain at all sacral borders, T-L junction, and upper thoracic  spine.  Special tests:   Supine-to-long-sit: legs level in supine, LLE short in sitting. (R up-slip and L anterior rotation)  Range of Motion/Flexibilty:  Spine: R SB with IPS pain, WNL ROM, R L SB WNL. B ROT ~ 50% with pull in R hip" with R rot, and pinch in L neck with L Rot. HS tightness stops forward bend, with slight flex in B knees.  Hips:   Strength/MMT: Deferred  LE MMT  LE MMT Left Right  Hip flex:  (L2) /5 /5  Hip ext: /5 /5  Hip abd: /5 /5  Hip add: /5 /5  Hip IR /5 /5  Hip ER /5 /5     Abdominal:  Palpation: TTP to B hip-flexors, adductors Diastasis: no diatasis0  Pelvic Floor External Exam: Deferred to follow-up Introitus Appears:  Skin integrity:  Palpation: Cough: Prolapse visible?: Scar mobility:  Internal Vaginal Exam: Strength (PERF):  Symmetry: Palpation: Prolapse:   Gait Analysis: deferred to follow-up  Outcome measures: FOTO PFDI Pain: 92, PFDI Urinary: 46, Bowel Leakage: 52, Urinary Problem: 48, PFDI Bowel: 63   Treatment:   Self-care: Informed Pt. Of her current physical status and Instructed Pt. To resume her former HEP to begin improving Sx. Discussed plan to improve compliance and maintain Sx. improvement following current Episode of care.                 Objective measurements completed on examination: See above findings.                 PT Short Term Goals - 09/03/19 1001      PT SHORT TERM GOAL #1   Title Patient will demonstrate improved pelvic alignment and balance of musculature surrounding the pelvis to facilitate decreased PFM spasms and decrease pelvic pain.    Baseline R up-slip, RLE long, L anterior rotation.    Time 5    Period Weeks    Status New    Target Date 10/08/19      PT SHORT TERM GOAL #2   Title Patient will report no episodes of UUI/SUI over the course of the prior two weeks to demonstrate improved functional ability.    Baseline Pt. having UUI and SUI with sneezing > 3 times  in a row. UUI with full bladder emptying    Time 5    Period Weeks    Status New    Target Date 10/08/19      PT SHORT TERM GOAL #3   Title Patient will demonstrate HEP x1 in the clinic and report compliance at least 4/7 days/wk to demonstrate understanding and proper form and committment to allow for further improvement.    Baseline Pt. has improved with Pelvic PT before but has started having Sx. again due to poor HEP compliance.    Time 5    Period Weeks    Status New    Target Date 10/08/19  PT SHORT TERM GOAL #4   Title Pt. will be compliant with stress reduction/ mental health initiatives to down-regulate nervous system and allow for decreased pain and incontinence.    Baseline Pt. is not taking any medication or recieving counselling and is under considerable stress which is contributing to her diverticulitis and PF Sx.    Time 5    Period Weeks    Status New    Target Date 10/08/19             PT Long Term Goals - 09/03/19 1015      PT LONG TERM GOAL #1   Title Pt. will improve in FOTO score by 10 points in each category) to demonstrate improved function.    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    Baseline Pt. having FI ~ 2x/month    Time 10    Period Weeks    Status New    Target Date 11/12/19      PT LONG TERM GOAL #3   Title Patient will be independent with HEP and stress-management routines and have demonstrated compliance for 2 consecutive weeks with a plan for long-term compliance in order to maintain long-term relief of Sx.    Baseline Pt. has  demonstrated ~ 80% improvement with PT prior but stopped HEP and had Sx. recurrence.    Time 10    Period Weeks    Status New    Target Date 11/12/19      PT LONG TERM GOAL #4   Title Patient will describe pain no greater than 4/10 during standing/walking for ~ 2 hours to demonstrate improved functional ability.    Baseline Pt. is unable to stand for> 10 min. without pain forcing her to sit down. Cannot clean the house of take grandkids out.    Time 10    Period Weeks    Status New    Target Date 11/12/19                  Plan - 09/03/19 0808    Clinical Impression Statement Pt. is a 69 y/o female who presents today with cheif c/o FI, UUI, and LBP. Her PMH is significant for an abdominal hysterectomy, cervical fusion, Lumbar laminectomy, R rotator cuff repair, R knee laparoscopy, diverticulitis, and anxiety. Her Clinical exam revealed a R up-slip and L anterior rotation, decreased scar-mobility in the RLQ, as well as spasms surrounding the pelvis and spine and decreased mobility through the sacrum, T-L junction, and upper thoracic spine. She has made significant improvement with PT before (~ 80% per Pt. report)) and is expected to respond well to pelvic PT to address the noted deficits and continue to assess for and address any other potential causes of Sx. She recognizes that she has not been doing her PT exercises and understands that she will have to be compliant with her HEP to see improvement. She has committed to setting aside 15 min .twice a day for PT exercises.  Personal Factors and Comorbidities Comorbidity 3+;Past/Current Experience    Comorbidities abdominal hysterectomy, cervical fusion, Lumbar laminectomy, R rotator cuff repair, R knee laparoscopy, diverticulitis, and anxiety. Her Clinical exam revealed a R up-slip and L anterior rotation, decreased scar-mobility in the RLQ    Examination-Activity Limitations Continence;Toileting;Locomotion Level;Bend;Stand;Stairs     Examination-Participation Restrictions Interpersonal Relationship;Laundry;Cleaning;Community Activity;Meal Prep;Yard Work    Stability/Clinical Decision Making Evolving/Moderate complexity    Clinical Decision Making Moderate    Rehab Potential Good    PT Frequency 1x / week    PT Duration Other (comment)   10 weeks   PT Next Visit Plan TP release To R QL, L hip-flexors and correctr for R up-slip, L anterior rotation. Assess heel-lift for proper height. review HEP.    PT Home Exercise Plan diaphragmatic breathing, 3-way wall stretch and seated posture- adjusted for at work treatment, hip-flexor stretch, walking mechanics, butterfly stretch in supine? (pt. says she has handout) and standing hip EXT and ABD, thoracic EXT over towel roll, self TP release with tennis ball    Consulted and Agree with Plan of Care Patient           Patient will benefit from skilled therapeutic intervention in order to improve the following deficits and impairments:  Abnormal gait, Decreased mobility, Difficulty walking, Hypomobility, Increased muscle spasms, Obesity, Improper body mechanics, Decreased range of motion, Decreased activity tolerance, Increased fascial restricitons, Pain, Postural dysfunction, Impaired flexibility, Decreased scar mobility  Visit Diagnosis: Abnormal posture  Other muscle spasm  Sacrococcygeal disorders, not elsewhere classified     Problem List There are no problems to display for this patient.  Cleophus Molt DPT, ATC Cleophus Molt 09/03/2019, 10:27 AM  [facility] MAIN [facility] SERVICES [address] [place], [place], [address] Phone: [phone]   Fax:  [phone]  Name: Mallory Weeks MRN: [id number] Date of Birth: 15-Oct-1950

## 2019-09-03 NOTE — Patient Instructions (Signed)
diaphragmatic breathing, 3-way wall stretch and seated posture- adjusted for at work treatment, hip-flexor stretch, walking mechanics, butterfly stretch in supine? (pt. says she has handout) and standing hip EXT and ABD, thoracic EXT over towel roll, self TP release with tennis ball

## 2019-09-19 ENCOUNTER — Other Ambulatory Visit: Payer: Self-pay

## 2019-09-19 ENCOUNTER — Ambulatory Visit: Payer: BC Managed Care – PPO | Attending: Gastroenterology

## 2019-09-19 DIAGNOSIS — M62838 Other muscle spasm: Secondary | ICD-10-CM | POA: Diagnosis present

## 2019-09-19 DIAGNOSIS — R293 Abnormal posture: Secondary | ICD-10-CM | POA: Diagnosis not present

## 2019-09-19 DIAGNOSIS — M533 Sacrococcygeal disorders, not elsewhere classified: Secondary | ICD-10-CM | POA: Diagnosis present

## 2019-09-19 NOTE — Patient Instructions (Signed)
As you start wearing your heel-lift only wear it for an hour the first day and increase by an hour each day so you can allow for the body to adapt to the change easily without much pain. If your pain increases by more than 1-2 points, back off slightly or slow down how quickly you increase your wear time. Once you reach a full day of wear, use it as much as possible forever, even in house-shoes or flip-flops if necessary to keep yourself from reverting to bad pelvic and spinal alignment and having symptoms return.    Adjust-a-lift heel lift Can be found at [email or web address]   Swing the leg until you feel the motion stop in the low back/hip. Do 2x15 on each side while adjusting to the heel-lift.

## 2019-09-19 NOTE — Therapy (Signed)
[facility] MAIN [facility] SERVICES [address] [place], [place], [address] Phone: [phone]   Fax:  [phone]  Physical Therapy Treatment  The patient has been informed of current processes in place at Outpatient Rehab to protect patients from Covid-19 exposure including social distancing, schedule modifications, and new cleaning procedures. After discussing their particular risk with a therapist based on the patient's personal risk factors, the patient has decided to proceed with in-person therapy.  Patient Details  Name: Mallory Weeks MRN: [id number] Date of Birth: Sep 18, 1950 No data recorded  Encounter Date: 09/19/2019   PT End of Session - 09/19/19 1117    Visit Number 2    Number of Visits 10    Date for PT Re-Evaluation 11/12/19    Authorization Type BCBS    Authorization Time Period 09/03/19 through 11/12/19    Authorization - Visit Number 2    Authorization - Number of Visits 10    Progress Note Due on Visit 10    PT Start Time 0735    PT Stop Time 0830    PT Time Calculation (min) 55 min    Activity Tolerance Patient tolerated treatment well;No increased pain    Behavior During Therapy WFL for tasks assessed/performed           Past Medical History:  Diagnosis Date  . Asthma   . Hypertension     Past Surgical History:  Procedure Laterality Date  . ABDOMINAL HYSTERECTOMY    . ANTERIOR FUSION CERVICAL SPINE    . CARPAL TUNNEL RELEASE Right   . LAMINECTOMY AND MICRODISCECTOMY LUMBAR SPINE    . LAPAROSCOPY Right    knee  . ROTATOR CUFF REPAIR Right     There were no vitals filed for this visit.   Pelvic Floor Physical Therapy Treatment Note  SCREENING  Changes in medications, allergies, or medical history?: none    SUBJECTIVE  Patient reports: Feeling a little stronger, can stand and walk a little more. Went swimming a couple weekends in a row.  Precautions:  Asthma, HTN, diverticulitis  Sexual  activity/pain: Yes, ~10 years, has avoided sex for a long time.  Location of pain: vaginally with intercourse Current pain: 0/10  Max pain: 9/10 Least pain: 0/10 Nature of pain:Sharp and cramping  Location of pain: Low back and hip Current pain: 0/10  Max pain: 4/10 Least pain: 0/10 Nature of pain:Spasm/achy  **4/10 "cramping" in the posterior hip on R following treatment.  Patient Goals: Be able to take the grandkids to the science center without fear of incontinence. To be able to stand long enough to do the dishes and clean up the house without intense pain (~ 4/10).     OBJECTIVE  Changes in: Posture/Observations:  R PSIS high in standing, RLE short in supine.  **following treatment, level PSIS with full-thickness adjust-a-lift in place.  Range of Motion/Flexibilty:  Decreased sacral mobility and TTP with pressure at all sacral borders.  Strength/MMT:  LE MMT:  Pelvic floor:  Abdominal:   Palpation: Highly TTP to R QL.  Gait Analysis:  INTERVENTIONS THIS SESSION: Manual: Performed TP release to R QL and R up-slip correction followed by grade 2-3 PA mobs at all sacral borders to decrease spasm and pain and allow for improved balance of musculature for improved pelvic alignment, function, and decreased symptoms.   Therex: reviewed importance of doing side-stretch to keep QL from tightening back down. Educated on and practiced leg-swings to maintain SIJ mobility as Pt.  Adapts to heel-lift. Reviewed how to do tennis ball release to help decrease spasms in R hip to decrease irritation from treatment/pain.  Self-care: reviewed importance of using a heel-lift that is not made of gel and that is at least 1 cm. Thick to maintain pelvic alignment, given another heel-lift to replace lost/messed up lift from prior treatment and re-educated Pt. On where to purchase more appropriate lifts.    Total time: 55  min.                               PT Short Term Goals - 09/03/19 1001      PT SHORT TERM GOAL #1   Title Patient will demonstrate improved pelvic alignment and balance of musculature surrounding the pelvis to facilitate decreased PFM spasms and decrease pelvic pain.    Baseline R up-slip, RLE long, L anterior rotation.    Time 5    Period Weeks    Status New    Target Date 10/08/19      PT SHORT TERM GOAL #2   Title Patient will report no episodes of UUI/SUI over the course of the prior two weeks to demonstrate improved functional ability.    Baseline Pt. having UUI and SUI with sneezing > 3 times in a row. UUI with full bladder emptying    Time 5    Period Weeks    Status New    Target Date 10/08/19      PT SHORT TERM GOAL #3   Title Patient will demonstrate HEP x1 in the clinic and report compliance at least 4/7 days/wk to demonstrate understanding and proper form and committment to allow for further improvement.    Baseline Pt. has improved with Pelvic PT before but has started having Sx. again due to poor HEP compliance.    Time 5    Period Weeks    Status New    Target Date 10/08/19      PT SHORT TERM GOAL #4   Title Pt. will be compliant with stress reduction/ mental health initiatives to down-regulate nervous system and allow for decreased pain and incontinence.    Baseline Pt. is not taking any medication or recieving counselling and is under considerable stress which is contributing to her diverticulitis and PF Sx.    Time 5    Period Weeks    Status New    Target Date 10/08/19             PT Long Term Goals - 09/03/19 1015      PT LONG TERM GOAL #1   Title Pt. will improve in FOTO score by 10 points in each category) to demonstrate improved function.    Baseline FOTO PFDI Pain: 92, PFDI Urinary: 46, Bowel Leakage: 52, Urinary Problem: 48, PFDI Bowel: 63    Time 10    Period Weeks    Status New    Target Date 11/12/19      PT  LONG TERM GOAL #2   Title Patient will report no episodes of Fecal Incontinence over the past two weeks to demonstrate improved function and quality of life and to allow her to participate in taking her grandkids out to a science center etc. as part of her occupation.    Baseline Pt. having FI ~ 2x/month    Time 10    Period Weeks    Status New    Target Date 11/12/19  PT LONG TERM GOAL #3   Title Patient will be independent with HEP and stress-management routines and have demonstrated compliance for 2 consecutive weeks with a plan for long-term compliance in order to maintain long-term relief of Sx.    Baseline Pt. has demonstrated ~ 80% improvement with PT prior but stopped HEP and had Sx. recurrence.    Time 10    Period Weeks    Status New    Target Date 11/12/19      PT LONG TERM GOAL #4   Title Patient will describe pain no greater than 4/10 during standing/walking for ~ 2 hours to demonstrate improved functional ability.    Baseline Pt. is unable to stand for> 10 min. without pain forcing her to sit down. Cannot clean the house of take grandkids out.    Time 10    Period Weeks    Status New    Target Date 11/12/19                 Plan - 09/19/19 1117    Clinical Impression Statement Pt. Responded well to all interventions today, demonstrating improved pelvic alignment with heel-lift in place, decreased QL spasm and improved sacral mobility with some increased discomfort/spasm in the R posterior hip as reaction to manual treatment, as well as understanding and correct performance of all education and exercises provided today. They will continue to benefit from skilled physical therapy to work toward remaining goals and maximize function as well as decrease likelihood of symptom increase or recurrence.    PT Next Visit Plan TP release To L hip-flexors and R posterior hip PRN. Ask about success with new heel-lift, review HEP.    PT Home Exercise Plan diaphragmatic  breathing, 3-way wall stretch and seated posture- adjusted for at work treatment, hip-flexor stretch, walking mechanics, butterfly stretch in supine? (pt. says she has handout) and standing hip EXT and ABD, thoracic EXT over towel roll, self TP release with tennis ball, standing leg-swings    Consulted and Agree with Plan of Care Patient           Patient will benefit from skilled therapeutic intervention in order to improve the following deficits and impairments:     Visit Diagnosis: Abnormal posture  Other muscle spasm  Sacrococcygeal disorders, not elsewhere classified     Problem List There are no problems to display for this patient.  Cleophus Molt DPT, ATC Cleophus Molt 09/19/2019, 11:29 AM  [facility] MAIN [facility] SERVICES [address] [place], [place], [address] Phone: [phone]   Fax:  [phone]  Name: Mallory Weeks MRN: [id number] Date of Birth: 1950/04/08

## 2019-09-26 ENCOUNTER — Ambulatory Visit: Payer: BC Managed Care – PPO

## 2019-09-26 ENCOUNTER — Other Ambulatory Visit: Payer: Self-pay

## 2019-09-26 DIAGNOSIS — M533 Sacrococcygeal disorders, not elsewhere classified: Secondary | ICD-10-CM

## 2019-09-26 DIAGNOSIS — M62838 Other muscle spasm: Secondary | ICD-10-CM

## 2019-09-26 DIAGNOSIS — R293 Abnormal posture: Secondary | ICD-10-CM

## 2019-09-26 NOTE — Therapy (Signed)
[facility] MAIN [facility] SERVICES [address] [place], [place], [address] Phone: [phone]   Fax:  [phone]  Physical Therapy Treatment  The patient has been informed of current processes in place at Outpatient Rehab to protect patients from Covid-19 exposure including social distancing, schedule modifications, and new cleaning procedures. After discussing their particular risk with a therapist based on the patient's personal risk factors, the patient has decided to proceed with in-person therapy.   Patient Details  Name: Mallory Weeks MRN: [id number] Date of Birth: November 19, 1950 No data recorded  Encounter Date: 09/26/2019   PT End of Session - 09/26/19 1313    Visit Number 3    Number of Visits 10    Date for PT Re-Evaluation 11/12/19    Authorization Type BCBS    Authorization Time Period 09/03/19 through 11/12/19    Authorization - Visit Number 3    Authorization - Number of Visits 10    Progress Note Due on Visit 10    PT Start Time 0800    PT Stop Time 0900    PT Time Calculation (min) 60 min    Activity Tolerance Patient tolerated treatment well;No increased pain    Behavior During Therapy WFL for tasks assessed/performed           Past Medical History:  Diagnosis Date  . Asthma   . Hypertension     Past Surgical History:  Procedure Laterality Date  . ABDOMINAL HYSTERECTOMY    . ANTERIOR FUSION CERVICAL SPINE    . CARPAL TUNNEL RELEASE Right   . LAMINECTOMY AND MICRODISCECTOMY LUMBAR SPINE    . LAPAROSCOPY Right    knee  . ROTATOR CUFF REPAIR Right     There were no vitals filed for this visit.   Pelvic Floor Physical Therapy Treatment Note  SCREENING  Changes in medications, allergies, or medical history?: none    SUBJECTIVE  Patient reports: Feels a little bit stronger. Had a terrible FI episode when she went to [place] with her grandkids and was in the parking lot. Had a strong urge and could not  make it. It was a huge mess. Didn't even get to go in the park due to weather. Was having 3-4 BM's per day and has started an anti-diarrheal. Used to be constipated and ever since being on a vegetarian diet she started having diarrhea and it has stayed that way since she stopped that diet. Is taking a probiotic daily.   Precautions:  Asthma, HTN, diverticulitis  Sexual activity/pain: Yes, ~10 years, has avoided sex for a long time.  Location of pain: vaginally with intercourse Current pain: 0/10  Max pain: 9/10 Least pain: 0/10 Nature of pain:Sharp and cramping  Location of pain: Low back and hip Current pain: 0/10  Max pain: 0/10 Least pain: 0/10 Nature of pain:Spasm/achy  **no increased pain following treatment.  Patient Goals: Be able to take the grandkids to the science center without fear of incontinence. To be able to stand long enough to do the dishes and clean up the house without intense pain (~ 4/10).     OBJECTIVE  Changes in: Posture/Observations:  PSIS level in standing.  Range of Motion/Flexibilty:   Strength/MMT:  LE MMT:   Pelvic floor:  Abdominal:  Pt. Taking short jagged breaths when cued for diaphragmatic breathing.  Palpation: TTP to R glute Med. And B diaphragm  Gait Analysis: Trendelenburg gait on R  **Following release to R glute Med. Pt.  Able to walk without trendelenburg   INTERVENTIONS THIS SESSION: Manual: Performed TP release to R glute Med. And B diaphragm to decrease spasm and pain and allow for improved ease of/functional breathing, and balance of musculature for improved pelvic alignment, function, gait, and decreased symptoms.   Therex: Reviewed side-lying hip ABD and how to walk without letting weight shift hard into R hip to engage the glute med and balance musculature surrounding the pelvis.  Self-care: Discussed the need to either have Pt. Use tool or let PT perform internal release to down-regulate PFM tension  and decrease FI. Discussed trying herbal supplement such as Ashwaganda to help decrease Anxiety and tension. Educated on using Psillium to bulk her stool and the availability of capsules since Pt. Opposed to flavor of metamucil. Educated on making sure she is eating prebiotic rich foods to get the most out of her probiotics and improve gut flora as well as the effect that stress and other factors have on GI Sx.   Total time: 60 min.                                PT Short Term Goals - 09/03/19 1001      PT SHORT TERM GOAL #1   Title Patient will demonstrate improved pelvic alignment and balance of musculature surrounding the pelvis to facilitate decreased PFM spasms and decrease pelvic pain.    Baseline R up-slip, RLE long, L anterior rotation.    Time 5    Period Weeks    Status New    Target Date 10/08/19      PT SHORT TERM GOAL #2   Title Patient will report no episodes of UUI/SUI over the course of the prior two weeks to demonstrate improved functional ability.    Baseline Pt. having UUI and SUI with sneezing > 3 times in a row. UUI with full bladder emptying    Time 5    Period Weeks    Status New    Target Date 10/08/19      PT SHORT TERM GOAL #3   Title Patient will demonstrate HEP x1 in the clinic and report compliance at least 4/7 days/wk to demonstrate understanding and proper form and committment to allow for further improvement.    Baseline Pt. has improved with Pelvic PT before but has started having Sx. again due to poor HEP compliance.    Time 5    Period Weeks    Status New    Target Date 10/08/19      PT SHORT TERM GOAL #4   Title Pt. will be compliant with stress reduction/ mental health initiatives to down-regulate nervous system and allow for decreased pain and incontinence.    Baseline Pt. is not taking any medication or recieving counselling and is under considerable stress which is contributing to her diverticulitis and PF Sx.     Time 5    Period Weeks    Status New    Target Date 10/08/19             PT Long Term Goals - 09/03/19 1015      PT LONG TERM GOAL #1   Title Pt. will improve in FOTO score by 10 points in each category) to demonstrate improved function.    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    Baseline Pt. having FI ~ 2x/month    Time 10    Period Weeks    Status New    Target Date 11/12/19      PT LONG TERM GOAL #3   Title Patient will be independent with HEP and stress-management routines and have demonstrated compliance for 2 consecutive weeks with a plan for long-term compliance in order to maintain long-term relief of Sx.    Baseline Pt. has demonstrated ~ 80% improvement with PT prior but stopped HEP and had Sx. recurrence.    Time 10    Period Weeks    Status New    Target Date 11/12/19      PT LONG TERM GOAL #4   Title Patient will describe pain no greater than 4/10 during standing/walking for ~ 2 hours to demonstrate improved functional ability.    Baseline Pt. is unable to stand for> 10 min. without pain forcing her to sit down. Cannot clean the house of take grandkids out.    Time 10    Period Weeks    Status New    Target Date 11/12/19                 Plan - 09/26/19 1314    Clinical Impression Statement Pt. Responded well to all interventions today, demonstrating improved ease of breathing without rigidity, improved gait mechanics, decreased spasm and TTP, as well as understanding and correct performance of all education and exercises provided today. They will  continue to benefit from skilled physical therapy to work toward remaining goals and maximize function as well as decrease likelihood of symptom increase or recurrence.    PT Next Visit Plan TP release To L hip-flexors and R posterior hip PRN. Ask about success with new heel-lift, review HEP.    PT Home Exercise Plan diaphragmatic breathing, 3-way wall stretch and seated posture- adjusted for at work treatment, hip-flexor stretch, walking mechanics, butterfly stretch in supine? (pt. says she has handout) and standing hip EXT and ABD, thoracic EXT over towel roll, self TP release with tennis ball, standing leg-swings, psillium, ashwagandah, prebiotic foods.    Consulted and Agree with Plan of Care Patient           Patient will benefit from skilled therapeutic intervention in order to improve the following deficits and impairments:     Visit Diagnosis: Abnormal posture  Other muscle spasm  Sacrococcygeal disorders, not elsewhere classified     Problem List There are no problems to display for this patient.  Cleophus Molt DPT, ATC Cleophus Molt 09/26/2019, 1:23 PM   [facility] MAIN [facility] SERVICES [address] [place], [place], [address] Phone: [phone]   Fax:  [phone]  Name: Mallory Weeks MRN: [id number] Date of Birth: 11/20/1950

## 2019-09-26 NOTE — Patient Instructions (Addendum)
   Look into getting some Ashwagandah for stress

## 2019-10-03 ENCOUNTER — Other Ambulatory Visit: Payer: Self-pay

## 2019-10-03 ENCOUNTER — Ambulatory Visit: Payer: BC Managed Care – PPO

## 2019-10-03 DIAGNOSIS — R293 Abnormal posture: Secondary | ICD-10-CM | POA: Diagnosis not present

## 2019-10-03 DIAGNOSIS — M533 Sacrococcygeal disorders, not elsewhere classified: Secondary | ICD-10-CM

## 2019-10-03 DIAGNOSIS — M62838 Other muscle spasm: Secondary | ICD-10-CM

## 2019-10-03 NOTE — Patient Instructions (Signed)
   Self External Trigger Point Relief    1) Wash your hands and prop yourself up in a way where you can easily reach the vagina. You may wish to have a small hand-held mirror near by.  2) Use the 2 middle fingers to put gentle pressure on the three external pelvic floor muscles and hold pressure and take deep breaths as you allow the tension to release and discomfort to dissipate   3) Repeat the process for any trigger points you find spending between 3-10 minutes on this every 1-2 days until you do not find any more trigger points or you are told otherwise by your therapist.  

## 2019-10-03 NOTE — Therapy (Signed)
[facility] MAIN [facility] SERVICES [address] [place], [place], [address] Phone: [phone]   Fax:  [phone]  Physical Therapy Treatment  The patient has been informed of current processes in place at Outpatient Rehab to protect patients from Covid-19 exposure including social distancing, schedule modifications, and new cleaning procedures. After discussing their particular risk with a therapist based on the patient's personal risk factors, the patient has decided to proceed with in-person therapy.  Patient Details  Name: Mallory Weeks MRN: [id number] Date of Birth: 1951/01/07 No data recorded  Encounter Date: 10/03/2019   PT End of Session - 10/03/19 1759    Visit Number 4    Number of Visits 10    Date for PT Re-Evaluation 11/12/19    Authorization Type BCBS    Authorization Time Period 09/03/19 through 11/12/19    Authorization - Visit Number 4    Authorization - Number of Visits 10    Progress Note Due on Visit 10    PT Start Time 0800    PT Stop Time 0900    PT Time Calculation (min) 60 min    Activity Tolerance Patient tolerated treatment well;No increased pain    Behavior During Therapy WFL for tasks assessed/performed           Past Medical History:  Diagnosis Date  . Asthma   . Hypertension     Past Surgical History:  Procedure Laterality Date  . ABDOMINAL HYSTERECTOMY    . ANTERIOR FUSION CERVICAL SPINE    . CARPAL TUNNEL RELEASE Right   . LAMINECTOMY AND MICRODISCECTOMY LUMBAR SPINE    . LAPAROSCOPY Right    knee  . ROTATOR CUFF REPAIR Right     There were no vitals filed for this visit.    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 Has had a little feeling of  bloating. Had one episode of UUI. Exercises are going well. No problem with the heel-lift.  Precautions:  Asthma, HTN, diverticulitis  Sexual activity/pain: Yes, ~10 years, has avoided sex for a long time.  Location of pain: vaginally with intercourse Current pain: 0/10  Max pain: 9/10 Least pain: 0/10 Nature of pain:Sharp and cramping  Location of pain: Low back and hip Current pain: 0/10  Max pain: 0/10 Least pain: 0/10 Nature of pain:Spasm/achy  **no increased pain following treatment.  Patient Goals: Be able to take the grandkids to the science center without fear of incontinence. To be able to stand long enough to do the dishes and clean up the house without intense pain (~ 4/10).     OBJECTIVE  Changes in: Posture/Observations:  PSIS level in standing.  Range of Motion/Flexibilty:   Strength/MMT:  LE MMT:   Pelvic Floor External Exam: Introitus Appears: WNL Skin integrity: WNL Palpation: TTP to all external PFM and Mons Cough: not assessed Prolapse visible?: not assessed Scar mobility: Decreased and highly sensitive  Internal Vaginal Exam: Strength (PERF): 4/5 Symmetry: similar B Palpation: highly TTP throughout with greatest at the posterior fourchette. Prolapse: not assessed.   Internal Rectal Exam: Strength (PERF): Symmetry: Palpation: Prolapse:  Abdominal:  Pt. Demonstrating ability to take slower smoother breaths to allow for TP release but requires frequent reminding to slow breaths.  Palpation:  TTP to B adductors and rectus abdominus at insertion.  Gait Analysis: No trendelenburg but Pt. Taking short, fast steps with no thoracic rotation.   INTERVENTIONS THIS SESSION: Manual: Assessed the pelvic floor and performed desensitization, then TP release to the posterior fourchette followed by coordination training for squeeze, release, and bulge, and educated Pt. On how to perform self external PFM release to decrease spasm and  pain and allow for improved balance of musculature for improved function and decreased symptoms.  Total time: 60 min.                             PT Short Term Goals - 09/03/19 1001      PT SHORT TERM GOAL #1   Title Patient will demonstrate improved pelvic alignment and balance of musculature surrounding the pelvis to facilitate decreased PFM spasms and decrease pelvic pain.    Baseline R up-slip, RLE long, L anterior rotation.    Time 5    Period Weeks    Status New    Target Date 10/08/19      PT SHORT TERM GOAL #2   Title Patient will report no episodes of UUI/SUI over the course of the prior two weeks to demonstrate improved functional ability.    Baseline Pt. having UUI and SUI with sneezing > 3 times in a row. UUI with full bladder emptying    Time 5    Period Weeks    Status New    Target Date 10/08/19      PT SHORT TERM GOAL #3   Title Patient will demonstrate HEP x1 in the clinic and report compliance at least 4/7 days/wk to demonstrate understanding and proper form and committment to allow for further improvement.    Baseline Pt. has improved with Pelvic PT before but has started having Sx. again due to poor HEP compliance.    Time 5    Period Weeks    Status New    Target Date 10/08/19      PT SHORT TERM GOAL #4   Title Pt. will be compliant with stress reduction/ mental health initiatives to down-regulate nervous system and allow for decreased pain and incontinence.    Baseline Pt. is not taking any medication or recieving counselling and is under considerable stress which is contributing to her diverticulitis and PF Sx.    Time 5    Period Weeks    Status New    Target Date 10/08/19             PT Long Term Goals - 09/03/19 1015      PT LONG TERM GOAL #1   Title Pt. will improve in FOTO score by 10 points in each category) to demonstrate improved function.    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    Baseline Pt. having FI ~ 2x/month    Time 10    Period Weeks    Status  New    Target Date 11/12/19      PT LONG TERM GOAL #3   Title Patient will be independent with HEP and stress-management routines and have demonstrated compliance for 2 consecutive weeks with a plan for long-term compliance in order to maintain long-term relief of Sx.    Baseline Pt. has demonstrated ~ 80% improvement with PT prior but stopped HEP and had Sx. recurrence.    Time 10    Period Weeks    Status New    Target Date 11/12/19      PT LONG TERM GOAL #4   Title Patient will describe pain no greater than 4/10 during standing/walking for ~ 2 hours to demonstrate improved functional ability.    Baseline Pt. is unable to stand for> 10 min. without pain forcing her to sit down. Cannot clean the house of take grandkids out.    Time 10    Period Weeks    Status New    Target Date 11/12/19                 Plan - 10/03/19 1759    Clinical Impression Statement Pt. Responded well to all interventions today, demonstrating improved PFM relaxation and coordination as well as understanding and correct performance of all education and exercises provided today. They will continue to benefit from skilled physical therapy to work toward remaining goals and maximize function as well as decrease likelihood of symptom increase or recurrence.    PT Next Visit Plan Further internal TP release, TP release To L hip-flexors and R posterior hip PRN. Ask about success with new heel-lift, review HEP.    PT Home Exercise Plan diaphragmatic breathing,  3-way wall stretch and seated posture- adjusted for at work treatment, hip-flexor stretch, walking mechanics, butterfly stretch in supine? (pt. says she has handout) and standing hip EXT and ABD, thoracic EXT over towel roll, self TP release with tennis ball, standing leg-swings, psillium, ashwagandah, prebiotic foods, self external PFM release.    Consulted and Agree with Plan of Care Patient           Patient will benefit from skilled therapeutic intervention in order to improve the following deficits and impairments:     Visit Diagnosis: Abnormal posture  Other muscle spasm  Sacrococcygeal disorders, not elsewhere classified     Problem List There are no problems to display for this patient.  Cleophus Molt DPT, ATC Cleophus Molt 10/03/2019, 6:08 PM  [place] [facility] MAIN [facility] SERVICES [address] [place], [place], [address] Phone: [phone]   Fax:  [phone]  Name: Mallory Weeks MRN: [id number] Date of Birth: [birth year]

## 2019-10-11 ENCOUNTER — Ambulatory Visit: Payer: BC Managed Care – PPO

## 2019-10-18 ENCOUNTER — Ambulatory Visit: Payer: BC Managed Care – PPO | Attending: Gastroenterology

## 2019-10-18 ENCOUNTER — Other Ambulatory Visit: Payer: Self-pay

## 2019-10-18 DIAGNOSIS — M533 Sacrococcygeal disorders, not elsewhere classified: Secondary | ICD-10-CM

## 2019-10-18 DIAGNOSIS — M62838 Other muscle spasm: Secondary | ICD-10-CM | POA: Diagnosis present

## 2019-10-18 DIAGNOSIS — R293 Abnormal posture: Secondary | ICD-10-CM

## 2019-10-18 NOTE — Patient Instructions (Signed)
Self Posterior Fourchette Stretching/Mobilization    1) Wash your hands and prop your body up so you can easily reach the vagina, bring hand-held mirror if desired.  2) Apply lubricant to the thumb and vaginal opening  3) Place thumb ~ 1/2 an inch into the vagina with the pad of the thumb pointed down and apply gentle pressure to the posterior fourchette.  4) Gently sweep the thumb side to side and in/out while maintaining pressure down toward the anus. Make sure the pressure is not so great that your muscles tighten up and guard, just enough to create slight discomfort.  Do this for ~ 3 min. Every 2-3 nights to decrease tightness and tenderness at the vaginal opening.

## 2019-10-18 NOTE — Therapy (Signed)
[facility] MAIN [facility] SERVICES [address] [place], [place], [address] Phone: [phone]   Fax:  [phone]  Physical Therapy Treatment  The patient has been informed of current processes in place at Outpatient Rehab to protect patients from Covid-19 exposure including social distancing, schedule modifications, and new cleaning procedures. After discussing their particular risk with a therapist based on the patient's personal risk factors, the patient has decided to proceed with in-person therapy.  Patient Details  Name: Mallory Weeks MRN: [id number] Date of Birth: Apr 03, 1950 No data recorded  Encounter Date: 10/18/2019   PT End of Session - 10/18/19 1020    Visit Number 5    Number of Visits 10    Date for PT Re-Evaluation 11/12/19    Authorization Type BCBS    Authorization Time Period 09/03/19 through 11/12/19    Authorization - Visit Number 5    Authorization - Number of Visits 10    Progress Note Due on Visit 10    PT Start Time 0735    PT Stop Time 0830    PT Time Calculation (min) 55 min    Activity Tolerance Patient tolerated treatment well;No increased pain    Behavior During Therapy WFL for tasks assessed/performed           Past Medical History:  Diagnosis Date  . Asthma   . Hypertension     Past Surgical History:  Procedure Laterality Date  . ABDOMINAL HYSTERECTOMY    . ANTERIOR FUSION CERVICAL SPINE    . CARPAL TUNNEL RELEASE Right   . LAMINECTOMY AND MICRODISCECTOMY LUMBAR SPINE    . LAPAROSCOPY Right    knee  . ROTATOR CUFF REPAIR Right     There were no vitals filed for this visit.  Pelvic Floor Physical Therapy Treatment Note  SCREENING  Changes in medications, allergies, or medical history?: none    SUBJECTIVE  Patient reports: She had 2 episodes of bladder incontinence (UUI) over the last 2 weeks. Had a pain 2 days ago in the L side/hip that lasted all day.   Precautions:  Asthma, HTN,  diverticulitis  Sexual activity/pain: Yes, ~10 years, has avoided sex for a long time.  Location of pain: vaginally with intercourse Current pain: 0/10  Max pain: 9/10 Least pain: 0/10 Nature of pain:Sharp and cramping  **feels "looser" down there following treatment.  Location of pain: Low back and hip Current pain: 0/10  Max pain: 0/10 Least pain: 0/10 Nature of pain:Spasm/achy  **no increased pain following treatment.  Patient Goals: Be able to take the grandkids to the science center without fear of incontinence. To be able to stand long enough to do the dishes and clean up the house without intense pain (~ 4/10).     OBJECTIVE  Changes in: Posture/Observations:  PSIS level in standing. (from prior session)  Range of Motion/Flexibilty:   Strength/MMT:  LE MMT:   Pelvic Floor External Exam: Introitus Appears: WNL Skin integrity: WNL Palpation: TTP to all external PFM and Mons Cough: not assessed Prolapse visible?: not assessed Scar mobility: Decreased and highly sensitive  Internal Vaginal Exam: Strength (PERF): 4/5 Symmetry: similar B Palpation: highly TTP throughout with greatest at the posterior fourchette. Prolapse: not assessed.   Internal Rectal Exam: Deferred indefinately Strength (PERF): Symmetry: Palpation: Prolapse:  TODAY: TTP to B anterior PR/PC with scar tissue and urge to urinate recreated on L>R with palpation.  Abdominal:  Pt. Demonstrating ability to take slower smoother breaths to allow  for TP release but requires frequent reminding to slow breaths.  Palpation: TTP to B adductors and rectus abdominus at insertion. (from prior session)  Gait Analysis: No trendelenburg but Pt. Taking short, fast steps with no thoracic rotation.   INTERVENTIONS THIS SESSION: Manual: Performed TP release and scar release to B anterior PR/PC internally to decrease UUI and to decrease spasm and pain and allow for improved balance of  musculature for improved function and decreased symptoms. Educated on how to perform self posterior fourchette release to continue to decrease sensitivity and spasm.  Total time: 60 min.                               PT Short Term Goals - 09/03/19 1001      PT SHORT TERM GOAL #1   Title Patient will demonstrate improved pelvic alignment and balance of musculature surrounding the pelvis to facilitate decreased PFM spasms and decrease pelvic pain.    Baseline R up-slip, RLE long, L anterior rotation.    Time 5    Period Weeks    Status New    Target Date 10/08/19      PT SHORT TERM GOAL #2   Title Patient will report no episodes of UUI/SUI over the course of the prior two weeks to demonstrate improved functional ability.    Baseline Pt. having UUI and SUI with sneezing > 3 times in a row. UUI with full bladder emptying    Time 5    Period Weeks    Status New    Target Date 10/08/19      PT SHORT TERM GOAL #3   Title Patient will demonstrate HEP x1 in the clinic and report compliance at least 4/7 days/wk to demonstrate understanding and proper form and committment to allow for further improvement.    Baseline Pt. has improved with Pelvic PT before but has started having Sx. again due to poor HEP compliance.    Time 5    Period Weeks    Status New    Target Date 10/08/19      PT SHORT TERM GOAL #4   Title Pt. will be compliant with stress reduction/ mental health initiatives to down-regulate nervous system and allow for decreased pain and incontinence.    Baseline Pt. is not taking any medication or recieving counselling and is under considerable stress which is contributing to her diverticulitis and PF Sx.    Time 5    Period Weeks    Status New    Target Date 10/08/19             PT Long Term Goals - 09/03/19 1015      PT LONG TERM GOAL #1   Title Pt. will improve in FOTO score by 10 points in each category) to demonstrate improved function.     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    Baseline Pt. having FI ~ 2x/month  Time 10    Period Weeks    Status New    Target Date 11/12/19      PT LONG TERM GOAL #3   Title Patient will be independent with HEP and stress-management routines and have demonstrated compliance for 2 consecutive weeks with a plan for long-term compliance in order to maintain long-term relief of Sx.    Baseline Pt. has demonstrated ~ 80% improvement with PT prior but stopped HEP and had Sx. recurrence.    Time 10    Period Weeks    Status New    Target Date 11/12/19      PT LONG TERM GOAL #4   Title Patient will describe pain no greater than 4/10 during standing/walking for ~ 2 hours to demonstrate improved functional ability.    Baseline Pt. is unable to stand for> 10 min. without pain forcing her to sit down. Cannot clean the house of take grandkids out.    Time 10    Period Weeks    Status New    Target Date 11/12/19                 Plan - 10/18/19 1020    Clinical Impression Statement Pt. Responded well to all interventions today, demonstrating decreased TTP and spasm through anterior PR/PC anteriorly as well as understanding and correct performance of all education and exercises provided today. They will continue to benefit from skilled physical therapy to work toward remaining goals and maximize function as well as decrease likelihood of symptom increase or recurrence.    PT Next Visit Plan Further internal TP release to OI, posterior PR/PC and coccygeus, TP release To L hip-flexors and R  posterior hip PRN. Ask about success with new heel-lift, review HEP.    PT Home Exercise Plan diaphragmatic breathing, 3-way wall stretch and seated posture- adjusted for at work treatment, hip-flexor stretch, walking mechanics, butterfly stretch in supine? (pt. says she has handout) and standing hip EXT and ABD, thoracic EXT over towel roll, self TP release with tennis ball, standing leg-swings, psillium, ashwagandah, prebiotic foods, self external PFM release, self posterior fourchette release.    Consulted and Agree with Plan of Care Patient           Patient will benefit from skilled therapeutic intervention in order to improve the following deficits and impairments:     Visit Diagnosis: Abnormal posture  Other muscle spasm  Sacrococcygeal disorders, not elsewhere classified     Problem List There are no problems to display for this patient.  Cleophus Molt DPT, ATC Cleophus Molt 10/18/2019, 10:21 AM  [facility] MAIN [facility] SERVICES [address] [place], [place], [address] Phone: [phone]   Fax:  [phone]  Name: Macee Venables MRN: [id number] Date of Birth: Oct 13, 1950

## 2019-10-25 ENCOUNTER — Ambulatory Visit: Payer: BC Managed Care – PPO

## 2019-10-25 ENCOUNTER — Other Ambulatory Visit: Payer: Self-pay

## 2019-10-25 DIAGNOSIS — R293 Abnormal posture: Secondary | ICD-10-CM | POA: Diagnosis not present

## 2019-10-25 DIAGNOSIS — M533 Sacrococcygeal disorders, not elsewhere classified: Secondary | ICD-10-CM

## 2019-10-25 DIAGNOSIS — M62838 Other muscle spasm: Secondary | ICD-10-CM

## 2019-10-25 NOTE — Patient Instructions (Signed)
Pelvic Tilt With Pelvic Floor (Hook-Lying)        Lie with hips and knees bent. Exhale and feel the low tummy draw in first, followed by the gluyes firing (a little) finally the upper abs drawing in (like ribs coming together) to flatten the low back so that pelvis tilts. Repeat _2x15__ times. Do _1-2__ times a day.

## 2019-10-25 NOTE — Therapy (Signed)
[facility] [address] [place], [place], [id number] Phone: [phone]   Fax:  [phone]  Physical Therapy Treatment  The patient has been informed of current processes in place at Outpatient Rehab to protect patients from Covid-19 exposure including social distancing, schedule modifications, and new cleaning procedures. After discussing their particular risk with a therapist based on the patient's personal risk factors, the patient has decided to proceed with in-person therapy.  Patient Details  Name: Mallory KlippelSandra Ann Dutt MRN: [id number] Date of Birth: [birth year] No data recorded  Encounter Date: 10/25/2019   PT End of Session - 10/25/19 0849    Visit Number 6    Number of Visits 10    Date for PT Re-Evaluation 11/12/19    Authorization Type BCBS    Authorization Time Period 09/03/19 through 11/12/19    Authorization - Visit Number 6    Authorization - Number of Visits 10    Progress Note Due on Visit 10    PT Start Time 0733    PT Stop Time 0833    PT Time Calculation (min) 60 min    Activity Tolerance Patient tolerated treatment well;No increased pain    Behavior During Therapy WFL for tasks assessed/performed           Past Medical History:  Diagnosis Date   Asthma    Hypertension     Past Surgical History:  Procedure Laterality Date   ABDOMINAL HYSTERECTOMY     ANTERIOR FUSION CERVICAL SPINE     CARPAL TUNNEL RELEASE Right    LAMINECTOMY AND MICRODISCECTOMY LUMBAR SPINE     LAPAROSCOPY Right    knee   ROTATOR CUFF REPAIR Right     There were no vitals filed for this visit.   Pelvic Floor Physical Therapy Treatment Note  SCREENING  Changes in medications, allergies, or medical history?: none    SUBJECTIVE  Patient reports: [place] able [place] [employer] and did pretty well. She was pushing a stroller and her pain got up to ~ 5/10 and she was there for ~ 3  hours. She has had 2 episodes of incontinence where she needed to change her pants, not full bladder emptying but quite a bit. No issues with bowels lately. Her work stress has come down quite a bit, finally caught up. Able to walk ~ 1 block before pain becomes intense enough she wants to sit down without support (improved from 1/2 a block).  Precautions:  Asthma, HTN, diverticulitis  Sexual activity/pain: Yes, ~10 years, has avoided sex for a long time.  Location of pain: vaginally with intercourse Current pain: 0/10  Max pain: 9/10 Least pain: 0/10 Nature of pain:Sharp and cramping  **feels "looser" down there following treatment.  Location of pain: Low back and R>L hip Current pain: 0/10 (in sitting) Max pain: 5/10 Least pain: 0/10 Nature of pain:Spasm/achy  **Pt  Reports feeling " a lot better" through the LB and R posterior hip with some discomfort when sitting.  Patient Goals: Be able to take the grandkids to the science center without fear of incontinence. To be able to stand long enough to do the dishes and clean up the house without intense pain (~ 4/10).     OBJECTIVE  Changes in: Posture/Observations:  PSIS level in standing. (from prior session)  Range of Motion/Flexibilty:   Strength/MMT:  LE MMT:   Pelvic Floor (from prior session) External Exam: Introitus Appears: WNL  Skin integrity: WNL Palpation: TTP to all external PFM and Mons Cough: not assessed Prolapse visible?: not assessed Scar mobility: Decreased and highly sensitive  Internal Vaginal Exam: Strength (PERF): 4/5 Symmetry: similar B Palpation: highly TTP throughout with greatest at the posterior fourchette. Prolapse: not assessed.   Internal Rectal Exam: Deferred indefinately Strength (PERF): Symmetry: Palpation: Prolapse:  Abdominal:  Pt. Requires MOD to MIN cueing for timing and recruitment of the TA, Obliques, and glutes.   **following treatment, ~ 50% recruitment  but timing appropriate with VC only.  Palpation: TTP to B adductors and rectus abdominus at insertion. (from prior session)  TODAY: TTP to R Piriformis and OI, decreased fascial mobility throughout the lower back, lateral abdomen, and posterior hips B.  Gait Analysis: No trendelenburg but Pt. Taking short, fast steps with no thoracic rotation and decreased eccentric foot control.   INTERVENTIONS THIS SESSION: Manual: Performed MFR with dynamic cupping technique and TP release to R Piriformis and OI, to decrease spasm and pain and allow for improved balance of musculature for improved function and decreased symptoms.   Therex: educated on and practiced posterior pelvic tilt in hook-lying and emphasis on recruitment and timing of the TA, glutes, and Obliques to improve deep-core stability and decrease LB and hip pain.  Total time: 60 min.                              PT Short Term Goals - 09/03/19 1001      PT SHORT TERM GOAL #1   Title Patient will demonstrate improved pelvic alignment and balance of musculature surrounding the pelvis to facilitate decreased PFM spasms and decrease pelvic pain.    Baseline R up-slip, RLE long, L anterior rotation.    Time 5    Period Weeks    Status New    Target Date 10/08/19      PT SHORT TERM GOAL #2   Title Patient will report no episodes of UUI/SUI over the course of the prior two weeks to demonstrate improved functional ability.    Baseline Pt. having UUI and SUI with sneezing > 3 times in a row. UUI with full bladder emptying    Time 5    Period Weeks    Status New    Target Date 10/08/19      PT SHORT TERM GOAL #3   Title Patient will demonstrate HEP x1 in the clinic and report compliance at least 4/7 days/wk to demonstrate understanding and proper form and committment to allow for further improvement.    Baseline Pt. has improved with Pelvic PT before but has started having Sx. again due to poor HEP compliance.     Time 5    Period Weeks    Status New    Target Date 10/08/19      PT SHORT TERM GOAL #4   Title Pt. will be compliant with stress reduction/ mental health initiatives to down-regulate nervous system and allow for decreased pain and incontinence.    Baseline Pt. is not taking any medication or recieving counselling and is under considerable stress which is contributing to her diverticulitis and PF Sx.    Time 5    Period Weeks    Status New    Target Date 10/08/19             PT Long Term Goals - 09/03/19 1015      PT LONG TERM GOAL #1  Title Pt. will improve in FOTO score by 10 points in each category) to demonstrate improved function.    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    Baseline Pt. having FI ~ 2x/month    Time 10    Period Weeks    Status New    Target Date 11/12/19      PT LONG TERM GOAL #3   Title Patient will be independent with HEP and stress-management routines and have demonstrated compliance for 2 consecutive weeks with a plan for long-term compliance in order to maintain long-term relief of Sx.    Baseline Pt. has demonstrated ~ 80% improvement with PT prior but stopped HEP and had Sx. recurrence.    Time 10    Period Weeks    Status New    Target Date 11/12/19      PT LONG TERM GOAL #4   Title Patient will describe pain no greater than 4/10 during standing/walking for ~ 2 hours to demonstrate improved functional ability.    Baseline Pt. is unable to stand for> 10 min. without pain forcing her to sit down. Cannot clean the house of take grandkids  out.    Time 10    Period Weeks    Status New    Target Date 11/12/19                 Plan - 10/25/19 0850    Clinical Impression Statement Pt. Responded well to all interventions today, demonstrating improved deep-core recruitment/timing, improved fascial mobility and decreased TTP and pain in the LB and posterior hip as well as understanding and correct performance of all education and exercises provided today. They will continue to benefit from skilled physical therapy to work toward remaining goals and maximize function as well as decrease likelihood of symptom increase or recurrence.    PT Next Visit Plan review posterior pelvic tilt in hook-lying, MFR to R>L anterior thighs with cupping, gait-training for foot-drop, Further internal TP release to OI, posterior PR/PC and coccygeus, TP release To L hip-flexors and R posterior hip PRN. Ask about success with new heel-lift, review HEP.    PT Home Exercise Plan diaphragmatic breathing, 3-way wall stretch and seated posture- adjusted for at work treatment, hip-flexor stretch, walking mechanics, butterfly stretch in supine? (pt. says she has handout) and standing hip EXT and ABD, thoracic EXT over towel roll, self TP release with tennis ball, standing leg-swings, psillium, ashwagandah, prebiotic foods, self external PFM release, self posterior fourchette release, posterior pelvic tilt in hook-lying.    Consulted and Agree with Plan of Care Patient           Patient will benefit from skilled therapeutic intervention in order to improve the following deficits and impairments:     Visit Diagnosis: Abnormal posture  Other muscle spasm  Sacrococcygeal disorders, not elsewhere classified     Problem List There are no problems to display for this patient.  Cleophus Molt DPT, ATC Cleophus Molt  10/25/2019, 9:06 AM  [facility] MAIN [facility] SERVICES [address] [place], [place],  [address] Phone: [phone]   Fax:  [phone]  Name: Isaura Schiller MRN: [id number] Date of Birth: Mar 01, 1951

## 2019-11-01 ENCOUNTER — Ambulatory Visit: Payer: BC Managed Care – PPO

## 2019-11-01 ENCOUNTER — Other Ambulatory Visit: Payer: Self-pay

## 2019-11-01 DIAGNOSIS — M62838 Other muscle spasm: Secondary | ICD-10-CM

## 2019-11-01 DIAGNOSIS — R293 Abnormal posture: Secondary | ICD-10-CM | POA: Diagnosis not present

## 2019-11-01 DIAGNOSIS — M533 Sacrococcygeal disorders, not elsewhere classified: Secondary | ICD-10-CM

## 2019-11-01 NOTE — Therapy (Signed)
[facility] MAIN [facility] SERVICES [address] [place], [place], [address] Phone: [phone]   Fax:  [phone]  Physical Therapy Treatment  The patient has been informed of current processes in place at Outpatient Rehab to protect patients from Covid-19 exposure including social distancing, schedule modifications, and new cleaning procedures. After discussing their particular risk with a therapist based on the patient's personal risk factors, the patient has decided to proceed with in-person therapy.   Patient Details  Name: Mallory Weeks MRN: [id number] Date of Birth: November 06, 1950 No data recorded  Encounter Date: 11/01/2019   PT End of Session - 11/01/19 1157    Visit Number 7    Number of Visits 10    Date for PT Re-Evaluation 11/12/19    Authorization Type BCBS    Authorization Time Period 09/03/19 through 11/12/19    Authorization - Visit Number 7    Authorization - Number of Visits 10    Progress Note Due on Visit 10    PT Start Time 0730    PT Stop Time 0830    PT Time Calculation (min) 60 min    Activity Tolerance Patient tolerated treatment well;No increased pain    Behavior During Therapy WFL for tasks assessed/performed           Past Medical History:  Diagnosis Date  . Asthma   . Hypertension     Past Surgical History:  Procedure Laterality Date  . ABDOMINAL HYSTERECTOMY    . ANTERIOR FUSION CERVICAL SPINE    . CARPAL TUNNEL RELEASE Right   . LAMINECTOMY AND MICRODISCECTOMY LUMBAR SPINE    . LAPAROSCOPY Right    knee  . ROTATOR CUFF REPAIR Right     There were no vitals filed for this visit.  Pelvic Floor Physical Therapy Treatment Note  SCREENING  Changes in medications, allergies, or medical history?: none    SUBJECTIVE  Patient reports: She has only had "one tiny dribble once" no accidents other than that.   Precautions:  Asthma, HTN, diverticulitis  Sexual activity/pain: Yes, ~10 years, has  avoided sex for a long time.  Location of pain: vaginally with intercourse Current pain: 0/10  Max pain: 9/10 Least pain: 0/10 Nature of pain:Sharp and cramping  **not addressed today.  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 To be able to stand long enough to do the dishes and clean up the house without intense pain (~ 4/10).     OBJECTIVE  Changes in: Posture/Observations:  PSIS level in standing. (from prior session)  Range of Motion/Flexibilty:   Strength/MMT:  LE MMT:   Pelvic Floor (from prior session) External Exam: Introitus Appears: WNL Skin integrity: WNL Palpation: TTP to all external PFM and Mons Cough: not assessed Prolapse visible?: not assessed Scar mobility: Decreased and highly sensitive  Internal Vaginal Exam: Strength (PERF): 4/5 Symmetry: similar B Palpation: highly TTP throughout with greatest at the posterior fourchette. Prolapse: not assessed.   Internal Rectal Exam: Deferred indefinately Strength (PERF): Symmetry: Palpation: Prolapse:  Abdominal:  Pt. Requires MOD to MIN cueing for timing and recruitment of the TA, Obliques, and glutes.   **following treatment, ~ 50% recruitment but timing appropriate with VC only.  Palpation: TTP to B adductors and rectus abdominus at  insertion. (from prior session)  TODAY: TTP to R TFL, Psoas, anterior tibialis and peroneals. Decreased fascial mobility through the entire R thigh and anterolateral leg.  Gait Analysis: Pt. Takes shorter stride with LLE due to hip-flexor tightness on R.   **following treatment Pt. Able to take step without increased pain but required focused attention to take equal stride lengths.   INTERVENTIONS THIS SESSION: Manual:  Performed MFR with dynamic cupping technique to anterior and lateral thigh and leg and TP release to  R TFL, Psoas, anterior tibialis and peroneals, to decrease spasm and pain and allow for improved balance of musculature for improved function and decreased symptoms.   Theract: encouraged pt. To focus on walking with equal stride length to help normalize gait and decrease liklihood of Sx. Recurrence.  Total time: 60 min.                               PT Short Term Goals - 09/03/19 1001      PT SHORT TERM GOAL #1   Title Patient will demonstrate improved pelvic alignment and balance of musculature surrounding the pelvis to facilitate decreased PFM spasms and decrease pelvic pain.    Baseline R up-slip, RLE long, L anterior rotation.    Time 5    Period Weeks    Status New    Target Date 10/08/19      PT SHORT TERM GOAL #2   Title Patient will report no episodes of UUI/SUI over the course of the prior two weeks to demonstrate improved functional ability.    Baseline Pt. having UUI and SUI with sneezing > 3 times in a row. UUI with full bladder emptying    Time 5    Period Weeks    Status New    Target Date 10/08/19      PT SHORT TERM GOAL #3   Title Patient will demonstrate HEP x1 in the clinic and report compliance at least 4/7 days/wk to demonstrate understanding and proper form and committment to allow for further improvement.    Baseline Pt. has improved with Pelvic PT before but has started having Sx. again due to poor HEP compliance.    Time 5    Period Weeks    Status New    Target Date 10/08/19      PT SHORT TERM GOAL #4   Title Pt. will be compliant with stress reduction/ mental health initiatives to down-regulate nervous system and allow for decreased pain and incontinence.    Baseline Pt. is not taking any medication or recieving counselling and is under considerable stress which is contributing to her diverticulitis and PF Sx.    Time 5     Period Weeks    Status New    Target Date 10/08/19             PT Long Term Goals - 09/03/19 1015      PT LONG TERM GOAL #1   Title Pt. will improve in FOTO score by 10 points in each category) to demonstrate improved function.    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    Baseline Pt. having FI ~ 2x/month    Time 10    Period Weeks    Status New    Target Date 11/12/19      PT LONG TERM GOAL #3   Title Patient will be independent with HEP and stress-management routines and have demonstrated compliance for 2 consecutive weeks with a plan for long-term compliance in order to maintain long-term relief of Sx.    Baseline Pt. has demonstrated ~ 80% improvement with PT prior but stopped HEP and had Sx. recurrence.    Time 10    Period Weeks    Status New    Target Date 11/12/19      PT LONG TERM GOAL #4   Title Patient will describe pain no greater than 4/10 during standing/walking for ~ 2 hours to demonstrate improved functional ability.    Baseline Pt. is unable to stand for> 10 min. without pain forcing her to sit down. Cannot clean the house of take grandkids out.    Time 10    Period Weeks    Status New    Target Date 11/12/19                 Plan - 11/01/19 1158    Clinical Impression Statement Pt. Responded well to all interventions today, demonstrating improved pain-free hip EXT ROM, decreased TTP and improved fascial mobility through RLE as well as understanding and correct performance of all education and exercises provided today. They will continue to benefit  from skilled physical therapy to work toward remaining goals and maximize function as well as decrease likelihood of symptom increase or recurrence.    PT Next Visit Plan re-assess pelvic alignment in standing, review posterior pelvic tilt in hook-lying, gait-training for foot-drop, Further internal TP release to OI, posterior PR/PC and coccygeus,MFR to L anterior thigh with cupping, TP release To L hip-flexors and R posterior hip PRN. review HEP.    PT Home Exercise Plan diaphragmatic breathing, 3-way wall stretch and seated posture- adjusted for at work treatment, hip-flexor stretch, walking mechanics, butterfly stretch in supine? (pt. says she has handout) and standing hip EXT and ABD, thoracic EXT over towel roll, self TP release with tennis ball, standing leg-swings, psillium, ashwagandah, prebiotic foods, self external PFM release, self posterior fourchette release, posterior pelvic tilt in hook-lying.    Consulted and Agree with Plan of Care Patient           Patient will benefit from skilled therapeutic intervention in order to improve the following deficits and impairments:     Visit Diagnosis: Abnormal posture  Other muscle spasm  Sacrococcygeal disorders, not elsewhere classified     Problem List There are no problems to display for this patient.  Cleophus Molt DPT, ATC Cleophus Molt 11/01/2019, 12:05 PM  [place] [facility] MAIN [facility] SERVICES [address] [place], [place], [address] Phone: [phone]   Fax:  [phone]  Name: Reianna Batdorf MRN: [id number] Date of Birth: 1950/05/27

## 2019-11-08 ENCOUNTER — Ambulatory Visit: Payer: BC Managed Care – PPO | Attending: Gastroenterology

## 2019-11-08 DIAGNOSIS — R293 Abnormal posture: Secondary | ICD-10-CM | POA: Insufficient documentation

## 2019-11-08 DIAGNOSIS — M62838 Other muscle spasm: Secondary | ICD-10-CM | POA: Insufficient documentation

## 2019-11-08 DIAGNOSIS — M533 Sacrococcygeal disorders, not elsewhere classified: Secondary | ICD-10-CM | POA: Insufficient documentation

## 2019-11-15 ENCOUNTER — Ambulatory Visit: Payer: BC Managed Care – PPO

## 2019-11-22 ENCOUNTER — Other Ambulatory Visit: Payer: Self-pay

## 2019-11-22 ENCOUNTER — Ambulatory Visit: Payer: BC Managed Care – PPO

## 2019-11-22 DIAGNOSIS — M62838 Other muscle spasm: Secondary | ICD-10-CM

## 2019-11-22 DIAGNOSIS — M533 Sacrococcygeal disorders, not elsewhere classified: Secondary | ICD-10-CM | POA: Diagnosis present

## 2019-11-22 DIAGNOSIS — R293 Abnormal posture: Secondary | ICD-10-CM | POA: Diagnosis present

## 2019-11-22 NOTE — Therapy (Signed)
[place] [facility] MAIN [facility] SERVICES [address] [place], [place], [address] Phone: [phone]   Fax:  [phone]  Physical Therapy Treatment  The patient has been informed of current processes in place at Outpatient Rehab to protect patients from Covid-19 exposure including social distancing, schedule modifications, and new cleaning procedures. After discussing their particular risk with a therapist based on the patient's personal risk factors, the patient has decided to proceed with in-person therapy.  Patient Details  Name: Mallory Weeks MRN: [id number] Date of Birth: 07-Nov-1950 No data recorded  Encounter Date: 11/22/2019   PT End of Session - 11/23/19 0904    Visit Number 8    Number of Visits 10    Date for PT Re-Evaluation 11/12/19    Authorization Type BCBS    Authorization Time Period 09/03/19 through 11/12/19    Authorization - Visit Number 8    Authorization - Number of Visits 10    Progress Note Due on Visit 10    PT Start Time 0737    PT Stop Time 0837    PT Time Calculation (min) 60 min    Activity Tolerance Patient tolerated treatment well;No increased pain    Behavior During Therapy WFL for tasks assessed/performed           Past Medical History:  Diagnosis Date   Asthma    Hypertension     Past Surgical History:  Procedure Laterality Date   ABDOMINAL HYSTERECTOMY     ANTERIOR FUSION CERVICAL SPINE     CARPAL TUNNEL RELEASE Right    LAMINECTOMY AND MICRODISCECTOMY LUMBAR SPINE     LAPAROSCOPY Right    knee   ROTATOR CUFF REPAIR Right     There were no vitals filed for this visit.  Pelvic Floor Physical Therapy Treatment Note  SCREENING  Changes in medications, allergies, or medical history?: none    SUBJECTIVE  Patient reports: She had one accident of UI with full bladder emptying when trying to delay urination. Doing well with BM's. Her back is still hurting some, mostly with sitting/working.  Had intercourse ~ 2 months ago with pain ~ 7/10.  Precautions:  Asthma, HTN, diverticulitis  Sexual activity/pain: Yes, ~10 years, has avoided sex for a long time.  Location of pain: vaginally with intercourse Current pain: 0/10  Max pain: 9/10 Least pain: 0/10 Nature of pain:Sharp and cramping  **not addressed today.  Location of pain: Low back and R>L hip Current pain: 2/10 (in sitting) Max pain: 6/10 Least pain: 0/10 Nature of pain:Spasm/achy  **some soreness in LB from treatment  Patient Goals: Be able to take the grandkids to the science center without fear of incontinence. To be able to stand long enough to do the dishes and clean up the house without intense pain (~ 4/10).     OBJECTIVE  Changes in: Posture/Observations:  R PSIS high   Range of Motion/Flexibilty:  Decreased mobility and TTP with PA sacral mobs that re-creates "familiar pain"   Strength/MMT:  LE MMT:   Pelvic Floor (from prior session) External Exam: Introitus Appears: WNL Skin integrity: WNL Palpation: TTP to all external PFM and Mons Cough: not assessed Prolapse visible?: not assessed Scar mobility: Decreased and highly sensitive  Internal Vaginal Exam: Strength (PERF): 4/5 Symmetry: similar B Palpation: highly TTP throughout with greatest at the posterior fourchette. Prolapse: not assessed.   Internal Rectal Exam: Deferred indefinately Strength (PERF): Symmetry: Palpation: Prolapse:  Abdominal:  Pt. Requires MOD to MIN cueing  for timing and recruitment of the TA, Obliques, and glutes. **following treatment, ~ 50% recruitment but timing appropriate with VC only. (from prior visit)  Palpation: TTP to B adductors and rectus abdominus at insertion. (from prior session)  TODAY: TTP to R iliacus and Psoas  Gait Analysis: Pt. Takes shorter stride with LLE due to hip-flexor tightness on R. **following treatment Pt. Able to take step without increased pain but  required focused attention to take equal stride lengths. (from prior session)   INTERVENTIONS THIS SESSION: Manual: Performed TP release to R iliacus and Psoas followed by grade 3-4 PA mobs to all sacral borders to decrease spasm and pain and allow for improved balance of musculature and pelvic alignment for improved function and decreased symptoms.   Therex: educated on and practiced side-lying bow-and-arrow thoracic rotations and self MET correction for R anterior innominate rotation to improve spinal mobility and pelvic alignment to help decrease pain and spasm and prevent of return of R anterior innominate rotation.   Total time: 60 min.                               PT Short Term Goals - 09/03/19 1001      PT SHORT TERM GOAL #1   Title Patient will demonstrate improved pelvic alignment and balance of musculature surrounding the pelvis to facilitate decreased PFM spasms and decrease pelvic pain.    Baseline R up-slip, RLE long, L anterior rotation.    Time 5    Period Weeks    Status New    Target Date 10/08/19      PT SHORT TERM GOAL #2   Title Patient will report no episodes of UUI/SUI over the course of the prior two weeks to demonstrate improved functional ability.    Baseline Pt. having UUI and SUI with sneezing > 3 times in a row. UUI with full bladder emptying    Time 5    Period Weeks    Status New    Target Date 10/08/19      PT SHORT TERM GOAL #3   Title Patient will demonstrate HEP x1 in the clinic and report compliance at least 4/7 days/wk to demonstrate understanding and proper form and committment to allow for further improvement.    Baseline Pt. has improved with Pelvic PT before but has started having Sx. again due to poor HEP compliance.    Time 5    Period Weeks    Status New    Target Date 10/08/19      PT SHORT TERM GOAL #4   Title Pt. will be compliant with stress reduction/ mental health initiatives to down-regulate nervous  system and allow for decreased pain and incontinence.    Baseline Pt. is not taking any medication or recieving counselling and is under considerable stress which is contributing to her diverticulitis and PF Sx.    Time 5    Period Weeks    Status New    Target Date 10/08/19             PT Long Term Goals - 09/03/19 1015      PT LONG TERM GOAL #1   Title Pt. will improve in FOTO score by 10 points in each category) to demonstrate improved function.    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    Baseline Pt. having FI ~ 2x/month    Time 10    Period Weeks    Status New    Target Date 11/12/19      PT LONG TERM GOAL #3   Title Patient will be independent with HEP and stress-management routines and have demonstrated compliance for 2 consecutive weeks with a plan for long-term compliance in order to maintain long-term relief of Sx.    Baseline Pt. has demonstrated ~ 80% improvement with PT prior but stopped HEP and had Sx. recurrence.    Time 10    Period Weeks    Status New    Target Date 11/12/19      PT LONG TERM GOAL #4   Title Patient will describe pain no greater than 4/10 during standing/walking for ~ 2 hours to demonstrate improved functional ability.    Baseline Pt. is unable to stand for> 10 min. without pain forcing her to sit down. Cannot clean the house of take grandkids out.    Time 10    Period Weeks    Status New    Target Date 11/12/19                 Plan - 11/23/19 0904    Clinical Impression Statement Pt. Responded well to all interventions today,  demonstrating decreased spasm and improved sacral mobility as well as understanding and correct performance of all education and exercises provided today. They will continue to benefit from skilled physical therapy to work toward remaining goals and maximize function as well as decrease likelihood of symptom increase or recurrence.    PT Next Visit Plan re-assess pelvic alignment in standing, review posterior pelvic tilt in hook-lying, gait-training for foot-drop, Further internal TP release to OI, posterior PR/PC and coccygeus,MFR to L anterior thigh with cupping, TP release To L hip-flexors and R posterior hip PRN. review HEP.    PT Home Exercise Plan diaphragmatic breathing, 3-way wall stretch and seated posture- adjusted for at work treatment, hip-flexor stretch, walking mechanics, butterfly stretch in supine? (pt. says she has handout) and standing hip EXT and ABD, thoracic EXT over towel roll, self TP release with tennis ball, standing leg-swings, psillium, ashwagandah, prebiotic foods, self external PFM release, self posterior fourchette release, posterior pelvic tilt in hook-lying.    Consulted and Agree with Plan of Care Patient           Patient will benefit from skilled therapeutic intervention in order to improve the following deficits and impairments:     Visit Diagnosis: Abnormal posture  Other muscle spasm  Sacrococcygeal disorders, not elsewhere classified     Problem List There are no problems to display for this patient.  Willa Rough DPT, ATC Willa Rough 11/23/2019, 9:11 AM  [place] MAIN [facility] SERVICES [address] [place], [place], [address] Phone: [phone]   Fax:  [phone]  Name: Mallory Weeks MRN: [id number] Date of Birth: 08-02-1950

## 2019-11-22 NOTE — Patient Instructions (Signed)
   Let the top arm rest on your side, and slide along the torso as you rotate. Breathe in as you come forward, out as you open up. Do 2x15 on each side.  MET to Correct Right Anteriorly Rotated/Left Posteriorly Rotated Innominate   Begin laying on your back with your feet at 90 degrees. Put a dowel/broomstick  through your legs, behind your right knee and in front of your left knee. Stabilize the dowel on ether side with your hands.  Press down with the right leg and up with the left leg. Hold for 5 seconds  then slowly relax. Repeat 5 times.

## 2019-11-29 ENCOUNTER — Ambulatory Visit: Payer: BC Managed Care – PPO

## 2019-12-13 ENCOUNTER — Ambulatory Visit: Payer: BC Managed Care – PPO | Attending: Gastroenterology

## 2019-12-13 ENCOUNTER — Other Ambulatory Visit: Payer: Self-pay

## 2019-12-13 DIAGNOSIS — R293 Abnormal posture: Secondary | ICD-10-CM | POA: Diagnosis not present

## 2019-12-13 DIAGNOSIS — M533 Sacrococcygeal disorders, not elsewhere classified: Secondary | ICD-10-CM | POA: Diagnosis present

## 2019-12-13 DIAGNOSIS — M62838 Other muscle spasm: Secondary | ICD-10-CM | POA: Diagnosis present

## 2019-12-13 NOTE — Patient Instructions (Addendum)
  Do 3x10 pulling the foot up and controlling as you come back down.  When walking try to control the foot as it comes down, to make it "quiet".

## 2019-12-13 NOTE — Therapy (Signed)
[facility] MAIN [facility] SERVICES [address] [place], [place], [address] Phone: [phone]   Fax:  [phone]  Physical Therapy Treatment  The patient has been informed of current processes in place at Outpatient Rehab to protect patients from Covid-19 exposure including social distancing, schedule modifications, and new cleaning procedures. After discussing their particular risk with a therapist based on the patient's personal risk factors, the patient has decided to proceed with in-person therapy.  Patient Details  Name: Mallory Weeks MRN: [id number] Date of Birth: Jul 14, 1950 No data recorded  Encounter Date: 12/13/2019   PT End of Session - 12/13/19 1203    Visit Number 9    Number of Visits 10    Date for PT Re-Evaluation 11/12/19    Authorization Type BCBS    Authorization Time Period 09/03/19 through 11/12/19    Authorization - Visit Number 9    Authorization - Number of Visits 10    Progress Note Due on Visit 10    PT Start Time 0730    PT Stop Time 0830    PT Time Calculation (min) 60 min    Activity Tolerance Patient tolerated treatment well;No increased pain    Behavior During Therapy WFL for tasks assessed/performed           Past Medical History:  Diagnosis Date  . Asthma   . Hypertension     Past Surgical History:  Procedure Laterality Date  . ABDOMINAL HYSTERECTOMY    . ANTERIOR FUSION CERVICAL SPINE    . CARPAL TUNNEL RELEASE Right   . LAMINECTOMY AND MICRODISCECTOMY LUMBAR SPINE    . LAPAROSCOPY Right    knee  . ROTATOR CUFF REPAIR Right     There were no vitals filed for this visit.  Pelvic Floor Physical Therapy Treatment Note  SCREENING  Changes in medications, allergies, or medical history?: none    SUBJECTIVE  Patient reports: She is doing really well! No bowel or bladder issues in 2 weeks but she sometimes gets spasms in her back when she is in bed. She sleeps on her Left stomach/side most of  the time. No knee pain and able to chase grandkids around 2 days in a row without issue and no cane, using stroller for some support with long walking.  Precautions:  Asthma, HTN, diverticulitis  Sexual activity/pain: Yes, ~10 years, has avoided sex for a long time.  Location of pain: vaginally with intercourse Current pain: 0/10  Max pain: 4/10 Least pain: 0/10 Nature of pain:Sharp and cramping  **not addressed today.  Location of pain: Low back and R>L hip Current pain: 2/10 (in sitting) Max pain: 6/10 Least pain: 0/10 Nature of pain:Spasm/achy  **no increased pain following treatment.  Patient Goals: Be able to take the grandkids to the science center without fear of incontinence. To be able to stand long enough to do the dishes and clean up the house without intense pain (~ 4/10).     OBJECTIVE  Changes in: Posture/Observations:  PSIS appear nearly level in standing.  Range of Motion/Flexibilty:  Decreased L>R thoracolumbar rotation in side-lying with pain/spasm with L rotation only.  Strength/MMT:  LE MMT:   Pelvic Floor (from prior session) External Exam: Introitus Appears: WNL Skin integrity: WNL Palpation: TTP to all external PFM and Mons Cough: not assessed Prolapse visible?: not assessed Scar mobility: Decreased and highly sensitive  Internal Vaginal Exam: Strength (PERF): 4/5 Symmetry: similar B Palpation: highly TTP throughout with greatest at the posterior  fourchette. Prolapse: not assessed.   Internal Rectal Exam: Deferred indefinately Strength (PERF): Symmetry: Palpation: Prolapse:  Abdominal:  Pt. Requires MOD to MIN cueing for timing and recruitment of the TA, Obliques, and glutes. **following treatment, ~ 50% recruitment but timing appropriate with VC only. (from prior visit)  Palpation: TTP to B adductors and rectus abdominus at insertion. (from prior session)  TODAY: TTP to R Piriformis and OI   Gait Analysis: Pt.  Takes shorter stride with LLE due to hip-flexor tightness on R. **following treatment Pt. Able to take step without increased pain but required focused attention to take equal stride lengths. (from prior session)   INTERVENTIONS THIS SESSION: Manual: Performed TP release to R Piriformis and OI followed by overpressure into rotation in side-lying 2x10 B to all to decrease spasm and pain and allow for improved balance of musculature and spinal mobility for improved function and decreased symptoms.  Therex: Educated on and practiced  seated DF with green band and reviewed bow-and-arrow thoracic rotations to improve spinal mobility and pelvic alignment to help decrease pain and spasm.  Total time: 60 min.                                PT Short Term Goals - 09/03/19 1001      PT SHORT TERM GOAL #1   Title Patient will demonstrate improved pelvic alignment and balance of musculature surrounding the pelvis to facilitate decreased PFM spasms and decrease pelvic pain.    Baseline R up-slip, RLE long, L anterior rotation.    Time 5    Period Weeks    Status New    Target Date 10/08/19      PT SHORT TERM GOAL #2   Title Patient will report no episodes of UUI/SUI over the course of the prior two weeks to demonstrate improved functional ability.    Baseline Pt. having UUI and SUI with sneezing > 3 times in a row. UUI with full bladder emptying    Time 5    Period Weeks    Status New    Target Date 10/08/19      PT SHORT TERM GOAL #3   Title Patient will demonstrate HEP x1 in the clinic and report compliance at least 4/7 days/wk to demonstrate understanding and proper form and committment to allow for further improvement.    Baseline Pt. has improved with Pelvic PT before but has started having Sx. again due to poor HEP compliance.    Time 5    Period Weeks    Status New    Target Date 10/08/19      PT SHORT TERM GOAL #4   Title Pt. will be compliant with stress  reduction/ mental health initiatives to down-regulate nervous system and allow for decreased pain and incontinence.    Baseline Pt. is not taking any medication or recieving counselling and is under considerable stress which is contributing to her diverticulitis and PF Sx.    Time 5    Period Weeks    Status New    Target Date 10/08/19             PT Long Term Goals - 09/03/19 1015      PT LONG TERM GOAL #1   Title Pt. will improve in FOTO score by 10 points in each category) to demonstrate improved function.    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    Baseline Pt. having FI ~ 2x/month    Time 10    Period Weeks    Status New    Target Date 11/12/19      PT LONG TERM GOAL #3   Title Patient will be independent with HEP and stress-management routines and have demonstrated compliance for 2 consecutive weeks with a plan for long-term compliance in order to maintain long-term relief of Sx.    Baseline Pt. has demonstrated ~ 80% improvement with PT prior but stopped HEP and had Sx. recurrence.    Time 10    Period Weeks    Status New    Target Date 11/12/19      PT LONG TERM GOAL #4   Title Patient will describe pain no greater than 4/10 during standing/walking for ~ 2 hours to demonstrate improved functional ability.    Baseline Pt. is unable to stand for> 10 min. without pain forcing her to sit down. Cannot clean the house of take grandkids out.    Time 10    Period Weeks    Status New    Target Date 11/12/19                 Plan - 12/13/19 1205    Clinical  Impression Statement Pt. Responded well to all interventions today, demonstrating improved between-session UI and FI Sx. As well as in-session decrease in spasm and pain and increased spinal rotation ROM, as well as understanding and correct performance of all education and exercises provided today. They will continue to benefit from skilled physical therapy to work toward remaining goals and maximize function as well as decrease likelihood of symptom increase or recurrence.    PT Next Visit Plan further spinal mobility and cupping to improve rotational ROM. test DF and hip IR strength, review posterior pelvic tilt in hook-lying, gait-training for foot-drop, Further internal TP release to OI, posterior PR/PC and coccygeus,MFR to L anterior thigh with cupping, TP release To L hip-flexors and R posterior hip PRN. review HEP.    PT Home Exercise Plan diaphragmatic breathing, 3-way wall stretch and seated posture- adjusted for at work treatment, hip-flexor stretch, walking mechanics, butterfly stretch in supine? (pt. says she has handout) and standing hip EXT and ABD, thoracic EXT over towel roll, self TP release with tennis ball, standing leg-swings, psillium, ashwagandah, prebiotic foods, self external PFM release, self posterior fourchette release, posterior pelvic tilt in hook-lying, DF against green band.    Consulted and Agree with Plan of Care Patient           Patient will benefit from skilled therapeutic intervention in order to improve the following deficits and impairments:     Visit Diagnosis: Abnormal posture  Other muscle spasm  Sacrococcygeal disorders, not elsewhere classified     Problem List There are no problems to display for this patient.  Cleophus MoltKeeli T. Raynald Rouillard DPT, ATC Cleophus MoltKeeli T Othella Slappey 12/13/2019, 12:30 PM  [place] [facility] MAIN REHAB SERVICES [address]  [place], [place], [address] Phone: [phone]   Fax:  [phone]  Name: Layloni Fahrner MRN: [id number] Date of Birth: 12/06/1950

## 2019-12-20 ENCOUNTER — Ambulatory Visit: Payer: BC Managed Care – PPO

## 2019-12-20 ENCOUNTER — Other Ambulatory Visit: Payer: Self-pay

## 2019-12-20 DIAGNOSIS — M62838 Other muscle spasm: Secondary | ICD-10-CM

## 2019-12-20 DIAGNOSIS — M533 Sacrococcygeal disorders, not elsewhere classified: Secondary | ICD-10-CM

## 2019-12-20 DIAGNOSIS — R293 Abnormal posture: Secondary | ICD-10-CM | POA: Diagnosis not present

## 2019-12-20 NOTE — Therapy (Signed)
[place] [facility] [address] [place], [place], [address] Phone: [phone]   Fax:  [phone]  Physical Therapy Progress Note   Dates of reporting period  09/03/19   to   12/20/19   The patient has been informed of current processes in place at Outpatient Rehab to protect patients from Covid-19 exposure including social distancing, schedule modifications, and new cleaning procedures. After discussing their particular risk with a therapist based on the patient's personal risk factors, the patient has decided to proceed with in-person therapy.  Patient Details  Name: Mallory KlippelSandra Ann Weeks MRN: [id number] Date of Birth: [birth year] No data recorded  Encounter Date: 12/20/2019   PT End of Session - 12/24/19 0904    Visit Number 10    Number of Visits 10    Date for PT Re-Evaluation 11/12/19    Authorization Type BCBS    Authorization Time Period 09/03/19 through 11/12/19    Authorization - Visit Number 10    Authorization - Number of Visits 10    Progress Note Due on Visit 10    PT Start Time 1630    PT Stop Time 1730    PT Time Calculation (min) 60 min    Activity Tolerance Patient tolerated treatment well;No increased pain    Behavior During Therapy WFL for tasks assessed/performed           Past Medical History:  Diagnosis Date  . Asthma   . Hypertension     Past Surgical History:  Procedure Laterality Date  . ABDOMINAL HYSTERECTOMY    . ANTERIOR FUSION CERVICAL SPINE    . CARPAL TUNNEL RELEASE Right   . LAMINECTOMY AND MICRODISCECTOMY LUMBAR SPINE    . LAPAROSCOPY Right    knee  . ROTATOR CUFF REPAIR Right     There were no vitals filed for this visit.  Pelvic Floor Physical Therapy Treatment Note  SCREENING  Changes in medications, allergies, or medical history?: none    SUBJECTIVE  Patient reports: Doing well, no FI or SUI/UUI and was able to walk around at the fair for 5 hours and the only thing that  hurt was her feet. Her back is still uncomfortable sometimes at night when she cannot find a good position. Down to only needing to take 3 Psillium capsules rather than 6 and is only having a BM 1-2 times per day.  Precautions:  Asthma, HTN, diverticulitis  Sexual activity/pain: Yes, ~10 years, has avoided sex for a long time.  Location of pain: vaginally with intercourse Current pain: 0/10  Max pain: 4/10 Least pain: 0/10 Nature of pain:Sharp and cramping  **not addressed today.  Location of pain: Low back and R>L hip Current pain: 0/10 (in sitting) Max pain: 5/10 Least pain: 0/10 Nature of pain:Spasm/achy  **no increased pain following treatment.  Patient Goals: Be able to take the grandkids to the science center without fear of incontinence. To be able to stand long enough to do the dishes and clean up the house without intense pain (~ 4/10).     OBJECTIVE  Changes in: Posture/Observations:  PSIS appear nearly level in standing.  Range of Motion/Flexibilty:  Decreased L>R thoracolumbar rotation in side-lying with pain/spasm with L rotation only.  ** following treatment, Pt. Able to achieve ~ 75% of opposite side AROM without pain.  Strength/MMT:  LE MMT:   Pelvic Floor (from prior session) External Exam: Introitus Appears: WNL Skin integrity: WNL Palpation: TTP to all external PFM and  Mons Cough: not assessed Prolapse visible?: not assessed Scar mobility: Decreased and highly sensitive  Internal Vaginal Exam: Strength (PERF): 4/5 Symmetry: similar B Palpation: highly TTP throughout with greatest at the posterior fourchette. Prolapse: not assessed.   Internal Rectal Exam: Deferred indefinately Strength (PERF): Symmetry: Palpation: Prolapse:  Abdominal:  Pt. Requires MOD to MIN cueing for timing and recruitment of the TA, Obliques, and glutes. **following treatment, ~ 50% recruitment but timing appropriate with VC only. (from prior  visit)  Palpation: TTP to B adductors and rectus abdominus at insertion. (from prior session)  TODAY: TTP to  L QL and erectors and multifidus near T/L junction  Gait Analysis: Pt. Demonstrates ~ 50% improved eccentric control of foot during loading and ~75% improved hip EXT with push-off on RLE. Continues to demonstrate ~ 50% reduced thoracic rotation ROM with gait.   INTERVENTIONS THIS SESSION: Manual: Performed  TP release to L QL and erectors and multifidus near T/L junction to improve L ROT ROM and to decrease spasm and pain and allow for improved balance of musculature and spinal mobility for improved function and decreased symptoms.  Self-care: re-assessed goals and made new goals and updated POC to allow for further functional improvement and decreased Sx.  Therex: Reviewed bow-and-arrow thoracic rotations wit and without over-presure to assess ROM and improve spinal mobility and pelvic alignment to help decrease pain and spasm.  Total time: 60 min.                               PT Short Term Goals - 12/24/19 0909      PT SHORT TERM GOAL #1   Title Patient will demonstrate improved pelvic alignment and balance of musculature surrounding the pelvis to facilitate decreased PFM spasms and decrease pelvic pain.    Baseline R up-slip, RLE long, L anterior rotation.    Time 5    Period Weeks    Status Achieved    Target Date 10/08/19      PT SHORT TERM GOAL #2   Title Patient will report no episodes of UUI/SUI over the course of the prior two weeks to demonstrate improved functional ability.    Baseline Pt. having UUI and SUI with sneezing > 3 times in a row. UUI with full bladder emptying    Time 5    Period Weeks    Status Achieved    Target Date 10/08/19      PT SHORT TERM GOAL #3   Title Patient will demonstrate HEP x1 in the clinic and report compliance at least 4/7 days/wk to demonstrate understanding and proper form and committment to allow  for further improvement.    Baseline Pt. has improved with Pelvic PT before but has started having Sx. again due to poor HEP compliance.    Time 5    Period Weeks    Status Achieved    Target Date 10/08/19      PT SHORT TERM GOAL #4   Title Pt. will be compliant with stress reduction/ mental health initiatives to down-regulate nervous system and allow for decreased pain and incontinence.    Baseline Pt. is not taking any medication or recieving counselling and is under considerable stress which is contributing to her diverticulitis and PF Sx.    Time 5    Period Weeks    Status Achieved    Target Date 10/08/19      PT SHORT TERM GOAL #5  Title Be able to clean and stand in line for 1 hr.  without pain > 4/10 to demonstrate improved postural strength and endurance.    Baseline Can do 30 min. of cleaning but pain increases to 8/10    Time 6    Period Weeks    Status New    Target Date 01/31/20             PT Long Term Goals - 12/24/19 0910      PT LONG TERM GOAL #1   Title Pt. will improve in FOTO score by 10 points in each category) to demonstrate improved function.    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    Baseline Pt. having FI ~ 2x/month    Time 10    Period Weeks    Status Achieved    Target Date 11/12/19      PT LONG TERM GOAL #3   Title Patient will be independent with HEP and stress-management routines and have demonstrated compliance for 2 consecutive weeks with a  plan for long-term compliance in order to maintain long-term relief of Sx.    Baseline Pt. has demonstrated ~ 80% improvement with PT prior but stopped HEP and had Sx. recurrence.    Time 10    Period Weeks    Status Achieved    Target Date 11/12/19      PT LONG TERM GOAL #4   Title Patient will describe pain no greater than 4/10 during standing/walking for ~ 2 hours to demonstrate improved functional ability.    Baseline Pt. is unable to stand for> 10 min. without pain forcing her to sit down. Cannot clean the house of take grandkids out. As of 10/14: Using a stroller as a walker/support and taking rest breaks (fair, children's museum, etc)    Time 10    Period Weeks    Status Achieved    Target Date 11/12/19      PT LONG TERM GOAL #5   Title Be able to walk for 30 min. straight for exercise without pain increasing more than 4/10 to demonstrate improved function and postural endurance.    Baseline Pain increases to 8/10 with walking continuously without support from a stroller or other AD.    Time 12    Period Weeks    Status New    Target Date 03/13/20                 Plan - 12/24/19 0904    Clinical Impression Statement Pt. has made significant progress with her bowel and bladder issues and has demonstrated increased function and decreased pain with intercourse and walking but contnues to be limited in ability to perform house-keeping activities and to walk for exercise for her overall wellness. She will continue to benefit from skilled pelvic health PT on an every-other week basis to allow for further improvement in  deep-core stability, spinal mobility, ROM, and hip strength to allow for increased exercise capacity to prevent further disease and injury and continue to increase functional capacity and decrease pain with ADL's.    Personal Factors and Comorbidities Comorbidity 3+;Past/Current Experience    Comorbidities abdominal hysterectomy, cervical fusion, Lumbar laminectomy,  R rotator cuff repair, R knee laparoscopy, diverticulitis, and anxiety. Her Clinical exam revealed a R up-slip and L anterior rotation, decreased scar-mobility in the RLQ    Examination-Activity Limitations Continence;Toileting;Locomotion Level;Bend;Stand;Stairs    Examination-Participation Restrictions Interpersonal Relationship;Laundry;Cleaning;Community Activity;Meal Prep;Yard Work    Conservation officer, historic buildings Evolving/Moderate complexity    Clinical Decision Making Moderate    Rehab Potential Good    PT Frequency Biweekly    PT Duration 12 weeks    PT Treatment/Interventions ADLs/Self Care Home Management;Biofeedback;Electrical Stimulation;Moist Heat;Ultrasound;Traction;Gait training;Stair training;Functional mobility training;Therapeutic activities;Neuromuscular re-education;Balance training;Therapeutic exercise;Patient/family education;Orthotic Fit/Training;Scar mobilization;Manual techniques;Passive range of motion;Dry needling;Taping;Joint Manipulations;Spinal Manipulations    PT Next Visit Plan Test DF and hip IR strength, progres ship and core strength and spinal mobility, review posterior pelvic tilt in hook-lying, gait-training for foot-drop, Further internal TP release to OI, posterior PR/PC and coccygeus,MFR to L anterior thigh with cupping, TP release To L hip-flexors and R posterior hip PRN. review HEP.    PT Home Exercise Plan diaphragmatic breathing, 3-way wall stretch and seated posture- adjusted for at work treatment, hip-flexor stretch, walking mechanics, butterfly stretch in supine? (pt. says she has handout) and standing hip EXT and ABD, thoracic EXT over towel roll, self TP release with tennis ball, standing leg-swings, psillium, ashwagandah, prebiotic foods, self external PFM release, self posterior fourchette release, posterior pelvic tilt in hook-lying, DF against green band.    Consulted and Agree with Plan of Care Patient           Patient will benefit from  skilled therapeutic intervention in order to improve the following deficits and impairments:  Abnormal gait, Decreased mobility, Difficulty walking, Hypomobility, Increased muscle spasms, Obesity, Improper body mechanics, Decreased range of motion, Decreased activity tolerance, Increased fascial restricitons, Pain, Postural dysfunction, Impaired flexibility, Decreased scar mobility  Visit Diagnosis: Abnormal posture  Other muscle spasm  Sacrococcygeal disorders, not elsewhere classified     Problem List There are no problems to display for this patient.  Cleophus Molt DPT, ATC Cleophus Molt 12/24/2019, 9:16 AM  [place] [facility] MAIN [facility] SERVICES [address] [place], [place], [address] Phone: [phone]   Fax:  [phone]  Name: Rekia Kujala MRN: [id number] Date of Birth: [birth year]

## 2019-12-27 ENCOUNTER — Other Ambulatory Visit: Payer: Self-pay

## 2019-12-27 ENCOUNTER — Ambulatory Visit: Payer: BC Managed Care – PPO

## 2019-12-27 DIAGNOSIS — M62838 Other muscle spasm: Secondary | ICD-10-CM

## 2019-12-27 DIAGNOSIS — R293 Abnormal posture: Secondary | ICD-10-CM

## 2019-12-27 DIAGNOSIS — M533 Sacrococcygeal disorders, not elsewhere classified: Secondary | ICD-10-CM

## 2019-12-27 NOTE — Therapy (Signed)
[place] [facility] MAIN [facility] SERVICES [address] [place], [place], [address] Phone: [phone]   Fax:  [phone]  Physical Therapy Treatment  The patient has been informed of current processes in place at Outpatient Rehab to protect patients from Covid-19 exposure including social distancing, schedule modifications, and new cleaning procedures. After discussing their particular risk with a therapist based on the patient's personal risk factors, the patient has decided to proceed with in-person therapy.  Patient Details  Name: Mallory Weeks MRN: [id number] Date of Birth: [birth year] No data recorded  Encounter Date: 12/27/2019   PT End of Session - 12/27/19 1726    Visit Number 11    Number of Visits 16    Date for PT Re-Evaluation 11/12/19    Authorization Type BCBS    Authorization Time Period 09/03/19 through 11/12/19    Authorization - Visit Number 11    Authorization - Number of Visits 6    Progress Note Due on Visit 16    PT Start Time 1630    PT Stop Time 1730    PT Time Calculation (min) 60 min    Activity Tolerance Patient tolerated treatment well;No increased pain    Behavior During Therapy WFL for tasks assessed/performed           Past Medical History:  Diagnosis Date  . Asthma   . Hypertension     Past Surgical History:  Procedure Laterality Date  . ABDOMINAL HYSTERECTOMY    . ANTERIOR FUSION CERVICAL SPINE    . CARPAL TUNNEL RELEASE Right   . LAMINECTOMY AND MICRODISCECTOMY LUMBAR SPINE    . LAPAROSCOPY Right    knee  . ROTATOR CUFF REPAIR Right     There were no vitals filed for this visit.  Pelvic Floor Physical Therapy Treatment Note  SCREENING  Changes in medications, allergies, or medical history?: none    SUBJECTIVE  Patient reports: Had a terrible bowel episode this morning. She skipped a dose of Psillium because she became constipated. She feels like she has a little tear and it hurt badly. Was  going home after her covid vaccine booster shot. She also had an episode of UUI also without much warning. She started feeling like she was rotated the last two days and both incidents have happened in this time frame. She tried to go on a walk yesterday and the day before and had a "teeny bit" of leakage but was able to make it mostly to the house.  Precautions:  Asthma, HTN, diverticulitis  Sexual activity/pain: Yes, ~10 years, has avoided sex for a long time.  Location of pain: vaginally with intercourse Current pain: 0/10  Max pain: 4/10 Least pain: 0/10 Nature of pain:Sharp and cramping  **not addressed today.  Location of pain: Low back and R>L hip Current pain: 0/10 (in sitting) Max pain: 5/10 Least pain: 0/10 Nature of pain:Spasm/achy  **no increased pain following treatment.  Patient Goals: Be able to take the grandkids to the science center without fear of incontinence. To be able to stand long enough to do the dishes and clean up the house without intense pain (~ 4/10).     OBJECTIVE  Changes in: Posture/Observations:  RLE long in supine and seated (appropriate since RLE is long).   Range of Motion/Flexibilty:  Decreased sacral mobility at R lateral border near base with pain.   Strength/MMT:  LE MMT:   Pelvic Floor (from prior session) External Exam: Introitus Appears: WNL Skin integrity:  WNL Palpation: TTP to all external PFM and Mons Cough: not assessed Prolapse visible?: not assessed Scar mobility: Decreased and highly sensitive  Internal Vaginal Exam: Strength (PERF): 4/5 Symmetry: similar B Palpation: highly TTP throughout with greatest at the posterior fourchette. Prolapse: not assessed.   Internal Rectal Exam: Deferred indefinately Strength (PERF): Symmetry: Palpation: Prolapse:  Abdominal:  Pt. Requires MOD to MIN cueing for timing and recruitment of the TA, Obliques, and glutes. **following treatment, ~ 50% recruitment  but timing appropriate with VC only. (from prior visit)  Palpation: TTP to R Psoas   Gait Analysis: Pt. Demonstrates ~ 50% improved eccentric control of foot during loading and ~75% improved hip EXT with push-off on RLE. Continues to demonstrate ~ 50% reduced thoracic rotation ROM with gait.   INTERVENTIONS THIS SESSION: Manual: Performed  TP release to R Psoas followed by MWM into R hip ABD/ER (clam-shell's) to decrease spasm and pain and allow for improved balance of musculature and spinal mobility for improved function and decreased symptoms.  Therex: Reviewed posterior pelvic tilts with emphasis on recruiting TA, followed by glutes and then Obliques to improve coordination and deep-core stability and decrease PFM tension and likelihood of incontinence. Educated on and practiced Hip EXT and ABD to improve pelvic stability and allow for decreased PFM tension.  Total time: 60 min.                               PT Short Term Goals - 12/24/19 0909      PT SHORT TERM GOAL #1   Title Patient will demonstrate improved pelvic alignment and balance of musculature surrounding the pelvis to facilitate decreased PFM spasms and decrease pelvic pain.    Baseline R up-slip, RLE long, L anterior rotation.    Time 5    Period Weeks    Status Achieved    Target Date 10/08/19      PT SHORT TERM GOAL #2   Title Patient will report no episodes of UUI/SUI over the course of the prior two weeks to demonstrate improved functional ability.    Baseline Pt. having UUI and SUI with sneezing > 3 times in a row. UUI with full bladder emptying    Time 5    Period Weeks    Status Achieved    Target Date 10/08/19      PT SHORT TERM GOAL #3   Title Patient will demonstrate HEP x1 in the clinic and report compliance at least 4/7 days/wk to demonstrate understanding and proper form and committment to allow for further improvement.    Baseline Pt. has improved with Pelvic PT before but  has started having Sx. again due to poor HEP compliance.    Time 5    Period Weeks    Status Achieved    Target Date 10/08/19      PT SHORT TERM GOAL #4   Title Pt. will be compliant with stress reduction/ mental health initiatives to down-regulate nervous system and allow for decreased pain and incontinence.    Baseline Pt. is not taking any medication or recieving counselling and is under considerable stress which is contributing to her diverticulitis and PF Sx.    Time 5    Period Weeks    Status Achieved    Target Date 10/08/19      PT SHORT TERM GOAL #5   Title Be able to clean and stand in line for 1 hr.  without pain > 4/10 to demonstrate improved postural strength and endurance.    Baseline Can do 30 min. of cleaning but pain increases to 8/10    Time 6    Period Weeks    Status New    Target Date 01/31/20             PT Long Term Goals - 12/24/19 0910      PT LONG TERM GOAL #1   Title Pt. will improve in FOTO score by 10 points in each category) to demonstrate improved function.    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    Baseline Pt. having FI ~ 2x/month    Time 10    Period Weeks    Status Achieved    Target Date 11/12/19      PT LONG TERM GOAL #3   Title Patient will be independent with HEP and stress-management routines and have demonstrated compliance for 2 consecutive weeks with a plan for long-term compliance in order to maintain long-term relief of Sx.     Baseline Pt. has demonstrated ~ 80% improvement with PT prior but stopped HEP and had Sx. recurrence.    Time 10    Period Weeks    Status Achieved    Target Date 11/12/19      PT LONG TERM GOAL #4   Title Patient will describe pain no greater than 4/10 during standing/walking for ~ 2 hours to demonstrate improved functional ability.    Baseline Pt. is unable to stand for> 10 min. without pain forcing her to sit down. Cannot clean the house of take grandkids out. As of 10/14: Using a stroller as a walker/support and taking rest breaks (fair, children's museum, etc)    Time 10    Period Weeks    Status Achieved    Target Date 11/12/19      PT LONG TERM GOAL #5   Title Be able to walk for 30 min. straight for exercise without pain increasing more than 4/10 to demonstrate improved function and postural endurance.    Baseline Pain increases to 8/10 with walking continuously without support from a stroller or other AD.    Time 12    Period Weeks    Status New    Target Date 03/13/20                 Plan - 12/27/19 1727    Clinical Impression Statement Pt. Responded well to all interventions today, demonstrating improved coordination and awareness of deep-core coordination and glute recruitment as well as understanding and correct performance of all education and exercises provided today. They will continue to benefit from skilled physical therapy to work toward remaining goals and maximize function as well as decrease likelihood of symptom increase or recurrence.   PT Next Visit Plan Test DF and hip IR strength, progres ship and core strength and  spinal mobility, review posterior pelvic tilt in hook-lying, gait-training for foot-drop, Further internal TP release to OI, posterior PR/PC and coccygeus,MFR to L anterior thigh with cupping, TP release To L hip-flexors and R posterior hip PRN. review HEP.    PT Home Exercise Plan diaphragmatic breathing, 3-way wall stretch and seated posture-  adjusted for at work treatment, hip-flexor stretch, walking mechanics, butterfly stretch in supine? (pt. says she has handout) and standing hip EXT and ABD, thoracic EXT over towel roll, self TP release with tennis ball, standing leg-swings, psillium, ashwagandah, prebiotic foods, self external PFM release, self posterior fourchette release, posterior pelvic tilt in hook-lying, DF against green band hip EXT and ABD .    Consulted and Agree with Plan of Care Patient           Patient will benefit from skilled therapeutic intervention in order to improve the following deficits and impairments:     Visit Diagnosis: Abnormal posture  Other muscle spasm  Sacrococcygeal disorders, not elsewhere classified     Problem List There are no problems to display for this patient.  Cleophus MoltKeeli T. Neta Upadhyay DPT, ATC Cleophus MoltKeeli T Tajanay Hurley 12/27/2019, 5:43 PM  [place] [facility]IN [facility] [address] [place], [place], [address] Phone: [phone]   Fax:  [phone]  Name: Mallory KlippelSandra Ann Weeks MRN: [id number] Date of Birth: 04-Feb-1951

## 2019-12-27 NOTE — Patient Instructions (Addendum)
Hip Abduction (Standing)    Stand with support. Squeeze deep core and hold. Start with leg to the side/back on your toe and gently squeeze the outer hip to lift the toe off of the ground. Hold for 1 second then repeat. You should feel this in the outer hip, not the front, not your side,  And not your back. Repeat __10_ times. Do _3__ sets  a day.  HIP Extension - Standing    Stand with support. Squeeze deep core and hold. Start with leg to the back on your toe and gently squeeze the buttock to lift the toe off of the ground. Hold for 1 second then repeat. You should feel this in the outer hip, not the front, not your side,  And not your back. Repeat __10_ times. Do _3__ sets  a day.   Pelvic Tilt With Pelvic Floor (Hook-Lying)        Lie with hips and knees bent. Exhale to feel the low tummy pull in first, followed by the glutes firing a little to flatten low back and finally keep breathing out until you feel the upper abs (ribcage area) pull in and together.Inhale to relax everything and repeat.    Use a folded hand-towel under the small of your back when you do this exercise and think "squish the towel" when you breath out.  Put a pillow under your hips in this position when doing this exercise to help decrease pressure in the pelvis when it feels "heavy" or if you are having increased leakage.

## 2020-01-03 ENCOUNTER — Ambulatory Visit: Payer: BC Managed Care – PPO

## 2020-01-10 ENCOUNTER — Ambulatory Visit: Payer: BC Managed Care – PPO | Attending: Gastroenterology

## 2020-01-10 ENCOUNTER — Other Ambulatory Visit: Payer: Self-pay

## 2020-01-10 DIAGNOSIS — M62838 Other muscle spasm: Secondary | ICD-10-CM | POA: Insufficient documentation

## 2020-01-10 DIAGNOSIS — M533 Sacrococcygeal disorders, not elsewhere classified: Secondary | ICD-10-CM

## 2020-01-10 DIAGNOSIS — R293 Abnormal posture: Secondary | ICD-10-CM | POA: Diagnosis present

## 2020-01-10 NOTE — Therapy (Signed)
[place] [facility] MAIN [facility] SERVICES [address] [place], [place], [address] Phone: [phone]   Fax:  [phone]  Physical Therapy Treatment  The patient has been informed of current processes in place at Outpatient Rehab to protect patients from Covid-19 exposure including social distancing, schedule modifications, and new cleaning procedures. After discussing their particular risk with a therapist based on the patient's personal risk factors, the patient has decided to proceed with in-person therapy.  Patient Details  Name: Mallory Weeks MRN: [id number] Date of Birth: 02/05/1951 No data recorded  Encounter Date: 01/10/2020   PT End of Session - 01/11/20 0711    Visit Number 12    Number of Visits 16    Date for PT Re-Evaluation 03/13/20    Authorization Type BCBS    Authorization Time Period 12/24/19 through 03/13/20    Authorization - Visit Number 12    Authorization - Number of Visits 16    Progress Note Due on Visit 16    PT Start Time 1530    PT Stop Time 1630    PT Time Calculation (min) 60 min    Activity Tolerance Patient tolerated treatment well;No increased pain    Behavior During Therapy WFL for tasks assessed/performed           Past Medical History:  Diagnosis Date  . Asthma   . Hypertension     Past Surgical History:  Procedure Laterality Date  . ABDOMINAL HYSTERECTOMY    . ANTERIOR FUSION CERVICAL SPINE    . CARPAL TUNNEL RELEASE Right   . LAMINECTOMY AND MICRODISCECTOMY LUMBAR SPINE    . LAPAROSCOPY Right    knee  . ROTATOR CUFF REPAIR Right     There were no vitals filed for this visit.  Pelvic Floor Physical Therapy Treatment Note  SCREENING  Changes in medications, allergies, or medical history?: none    SUBJECTIVE  Patient reports: Is doing pretty well but had one episode of bowel incontinence where she was standing in the kitchen and couldn't make it 2 rooms away to the bathroom. Had been having  a little bit looser stool that day. Cannot think of any dietary or stress trigger. This was Monday 11/1. Had a "diverticulitis pain" as well on Tuesday. She got a "screaming out pain" that happens when she wipes but is felt mostly in the R lower abdomen. Feeling LBP when sitting or lying down in bed. Getting pain when doing her pelvic tilts.  Precautions:  Asthma, HTN, diverticulitis  Sexual activity/pain: Yes, ~10 years, has avoided sex for a long time.  Location of pain: vaginally with intercourse Current pain: 0/10  Max pain: 4/10 Least pain: 0/10 Nature of pain:Sharp and cramping  **not addressed today.  Location of pain: Low back and R>L hip Current pain: 0/10  Max pain: 5/10 Least pain: 0/10 Nature of pain:Spasm/achy  **no increased pain following treatment.  Patient Goals: Be able to take the grandkids to the science center without fear of incontinence. To be able to stand long enough to do the dishes and clean up the house without intense pain (~ 4/10).     OBJECTIVE  Changes in: Posture/Observations:  RLE long in supine and seated (appropriate since RLE is long).   RLE externally rotated in standing  Range of Motion/Flexibilty:  Decreased sacral mobility at R lateral border near base with pain.  R 90/90: 45 deg. L 90/90: -40 deg  R hip ER/IR: 45/20 L hip ER/IR: 60/30  **  increased PROM by ~ 5 deg. Into R ER?IR post treatment with less discomfort.  Strength/MMT:  LE MMT:   Pelvic Floor (from prior session) External Exam: Introitus Appears: WNL Skin integrity: WNL Palpation: TTP to all external PFM and Mons Cough: not assessed Prolapse visible?: not assessed Scar mobility: Decreased and highly sensitive  Internal Vaginal Exam: Strength (PERF): 4/5 Symmetry: similar B Palpation: highly TTP throughout with greatest at the posterior fourchette. Prolapse: not assessed.   Internal Rectal Exam: Deferred indefinately Strength  (PERF): Symmetry: Palpation: Prolapse:  Abdominal:  Pt. Requires MOD to MIN cueing for timing and recruitment of the TA, Obliques, and glutes. **following treatment, ~ 50% recruitment but timing appropriate with VC only. (from prior visit)  Palpation: TTP to R pectineus, OI and Piriformis  Gait Analysis: Pt. Demonstrates ~ 50% improved eccentric control of foot during loading and ~75% improved hip EXT with push-off on RLE. Continues to demonstrate ~ 50% reduced thoracic rotation ROM with gait.   INTERVENTIONS THIS SESSION: Manual: Performed  TP release to R pectineus, OI and Piriformis to decrease spasm and pain and allow for improved balance of musculature and spinal mobility for improved function and decreased symptoms.  Therex: Educated on and practiced replacement stretches for 3-way wall stretch including Child's pose, frog stretch, and seated HS stretch as well as added a hip IR stretch to improve balance of musculature surrounding the hip and decrease pressure on nerves in the pelvis to improve PFM relaxation and increase function to prevent UUI and FI.  Self-care: educated Pt. On how to take standing breaks in short increments to prevent sleepy glute syndrome from making her Sx. Return.  Total time: 60 min.                               PT Short Term Goals - 12/24/19 0909      PT SHORT TERM GOAL #1   Title Patient will demonstrate improved pelvic alignment and balance of musculature surrounding the pelvis to facilitate decreased PFM spasms and decrease pelvic pain.    Baseline R up-slip, RLE long, L anterior rotation.    Time 5    Period Weeks    Status Achieved    Target Date 10/08/19      PT SHORT TERM GOAL #2   Title Patient will report no episodes of UUI/SUI over the course of the prior two weeks to demonstrate improved functional ability.    Baseline Pt. having UUI and SUI with sneezing > 3 times in a row. UUI with full bladder emptying     Time 5    Period Weeks    Status Achieved    Target Date 10/08/19      PT SHORT TERM GOAL #3   Title Patient will demonstrate HEP x1 in the clinic and report compliance at least 4/7 days/wk to demonstrate understanding and proper form and committment to allow for further improvement.    Baseline Pt. has improved with Pelvic PT before but has started having Sx. again due to poor HEP compliance.    Time 5    Period Weeks    Status Achieved    Target Date 10/08/19      PT SHORT TERM GOAL #4   Title Pt. will be compliant with stress reduction/ mental health initiatives to down-regulate nervous system and allow for decreased pain and incontinence.    Baseline Pt. is not taking any medication or recieving  counselling and is under considerable stress which is contributing to her diverticulitis and PF Sx.    Time 5    Period Weeks    Status Achieved    Target Date 10/08/19      PT SHORT TERM GOAL #5   Title Be able to clean and stand in line for 1 hr.  without pain > 4/10 to demonstrate improved postural strength and endurance.    Baseline Can do 30 min. of cleaning but pain increases to 8/10    Time 6    Period Weeks    Status New    Target Date 01/31/20             PT Long Term Goals - 12/24/19 0910      PT LONG TERM GOAL #1   Title Pt. will improve in FOTO score by 10 points in each category) to demonstrate improved function.    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    Baseline Pt.  having FI ~ 2x/month    Time 10    Period Weeks    Status Achieved    Target Date 11/12/19      PT LONG TERM GOAL #3   Title Patient will be independent with HEP and stress-management routines and have demonstrated compliance for 2 consecutive weeks with a plan for long-term compliance in order to maintain long-term relief of Sx.    Baseline Pt. has demonstrated ~ 80% improvement with PT prior but stopped HEP and had Sx. recurrence.    Time 10    Period Weeks    Status Achieved    Target Date 11/12/19      PT LONG TERM GOAL #4   Title Patient will describe pain no greater than 4/10 during standing/walking for ~ 2 hours to demonstrate improved functional ability.    Baseline Pt. is unable to stand for> 10 min. without pain forcing her to sit down. Cannot clean the house of take grandkids out. As of 10/14: Using a stroller as a walker/support and taking rest breaks (fair, children's museum, etc)    Time 10    Period Weeks    Status Achieved    Target Date 11/12/19      PT LONG TERM GOAL #5   Title Be able to walk for 30 min. straight for exercise without pain increasing more than 4/10 to demonstrate improved function and postural endurance.    Baseline Pain increases to 8/10 with walking continuously without support from a stroller or other AD.    Time 12    Period Weeks    Status New    Target Date 03/13/20                 Plan - 01/11/20 0713    Clinical Impression Statement Pt. Responded well to all interventions today, demonstrating improved hip ER?IR ROM and understanding of how  prolonged sitting is effecting her glute recruitment and PFM function, as well as understanding and correct performance of all education and exercises provided today. They will continue to benefit from skilled physical therapy to work toward remaining goals and maximize function as well as decrease likelihood of symptom increase or recurrence.    PT Next Visit Plan fascial mobility for B HS and R  glute region, review standing desk Test DF and hip IR strength, progress hep (consider adding ER/IR and core strength and spinal mobility, review posterior pelvic tilt in hook-lying, gait-training for foot-drop, Further internal TP release to OI, posterior PR/PC and coccygeus,MFR to L anterior thigh with cupping, TP release To L hip-flexors and R posterior hip PRN. review HEP.    PT Home Exercise Plan diaphragmatic breathing, 3-way wall stretch and seated posture- adjusted for at work treatment, hip-flexor stretch, walking mechanics, butterfly stretch in supine? (pt. says she has handout) and standing hip EXT and ABD, thoracic EXT over towel roll, self TP release with tennis ball, standing leg-swings, psillium, ashwagandah, prebiotic foods, self external PFM release, self posterior fourchette release, posterior pelvic tilt in hook-lying, DF against green band hip EXT and ABD .    Consulted and Agree with Plan of Care Patient           Patient will benefit from skilled therapeutic intervention in order to improve the following deficits and impairments:     Visit Diagnosis: Abnormal posture  Other muscle spasm  Sacrococcygeal disorders, not elsewhere classified     Problem List There are no problems to display for this patient.  Cleophus Molt DPT, ATC Cleophus Molt 01/11/2020, 7:24 AM  [place] [facility] MAIN [facility] SERVICES [address] [place], [place], [address] Phone: [phone]   Fax:  [phone]  Name: Mallory Weeks MRN: [id number] Date of Birth: 1951/02/01

## 2020-01-10 NOTE — Patient Instructions (Signed)
Child's Pose Pelvic Floor Lengthening    Sit in knee-chest position and reach arms forward. Separate knees for comfort. Hold position for _5__ breaths. Repeat _2-3__ times. Do _1-2__ times per day.   Take 5 deep breaths, allowing gravity to gently pull you into a deeper stretch with each breath. Repeat 2-3 times, once a day.  Seated -in chair hamstring stretch    Perform 2 sets of 10 belly breaths to both sides. Repeat 1-2/day   Action: Stick on leg out, with toes pointing up towards your nose and knee straight. With abdominals engaged, sitting up right, gently lean forward till you feel a gentle stretch at the back of your leg.     Laying Down- Hamstring stretch   Perform 3 sets of 5 belly breaths to both sides. Repeat / day    Hold for 5 deep breaths, repeat 2-3 times on each side.

## 2020-01-18 ENCOUNTER — Other Ambulatory Visit: Payer: Self-pay

## 2020-01-18 DIAGNOSIS — R928 Other abnormal and inconclusive findings on diagnostic imaging of breast: Secondary | ICD-10-CM

## 2020-01-24 ENCOUNTER — Ambulatory Visit: Payer: BC Managed Care – PPO

## 2020-01-24 ENCOUNTER — Other Ambulatory Visit: Payer: Self-pay

## 2020-01-24 DIAGNOSIS — M533 Sacrococcygeal disorders, not elsewhere classified: Secondary | ICD-10-CM

## 2020-01-24 DIAGNOSIS — R293 Abnormal posture: Secondary | ICD-10-CM

## 2020-01-24 DIAGNOSIS — M62838 Other muscle spasm: Secondary | ICD-10-CM

## 2020-01-24 NOTE — Therapy (Addendum)
[facility] [facility] MAIN [facility] SERVICES [address] [place], [place], [address] Phone: [phone]   Fax:  [phone]  Physical Therapy Treatment  The patient has been informed of current processes in place at Outpatient Rehab to protect patients from Covid-19 exposure including social distancing, schedule modifications, and new cleaning procedures. After discussing their particular risk with a therapist based on the patient's personal risk factors, the patient has decided to proceed with in-person therapy.  Patient Details  Name: Mallory Weeks MRN: [id number] Date of Birth: 07-08-1950 No data recorded  Encounter Date: 01/24/2020   PT End of Session - 01/28/20 1010    Visit Number 13    Number of Visits 16    Date for PT Re-Evaluation 03/13/20    Authorization Type BCBS    Authorization Time Period 12/24/19 through 03/13/20    Authorization - Visit Number 13    Authorization - Number of Visits 16    Progress Note Due on Visit 16    PT Start Time 1630    PT Stop Time 1730    PT Time Calculation (min) 60 min    Activity Tolerance Patient tolerated treatment well;No increased pain    Behavior During Therapy WFL for tasks assessed/performed           Past Medical History:  Diagnosis Date  . Asthma   . Hypertension     Past Surgical History:  Procedure Laterality Date  . ABDOMINAL HYSTERECTOMY    . ANTERIOR FUSION CERVICAL SPINE    . CARPAL TUNNEL RELEASE Right   . LAMINECTOMY AND MICRODISCECTOMY LUMBAR SPINE    . LAPAROSCOPY Right    knee  . ROTATOR CUFF REPAIR Right     There were no vitals filed for this visit.   Pelvic Floor Physical Therapy Treatment Note  SCREENING  Changes in medications, allergies, or medical history?: none    SUBJECTIVE  Patient reports: No episodes of bowel or bladder incontinence. Not much pain to speak of. Intercourse was still ~ 4/10. Was able to walk 15-20 min. With pain up to 4/10.     Precautions:  Asthma, HTN, diverticulitis  Sexual activity/pain: Yes, ~10 years, has avoided sex for a long time.  Location of pain: vaginally with intercourse Current pain: 0/10  Max pain: 4/10 Least pain: 0/10 Nature of pain:Sharp and cramping  **not addressed today.  Location of pain: Low back and R posterior Current pain: 0/10  Max pain: 4/10 Least pain: 0/10 Nature of pain:Spasm/achy  **no increased pain following treatment.  Patient Goals: Be able to take the grandkids to the science center without fear of incontinence. To be able to stand long enough to do the dishes and clean up the house without intense pain (~ 4/10).     OBJECTIVE  Changes in: Posture/Observations:  RLE long in supine and seated (appropriate since RLE is long).   RLE externally rotated in standing PSIS level with heel-lift in place  Range of Motion/Flexibilty:  Decreased sacral mobility at R lateral border near base with pain.  R 90/90: 45 deg. L 90/90: -40 deg  R hip ER/IR: 45/20 L hip ER/IR: 60/30  ** increased PROM by ~ 5 deg. Into R ER/IR post treatment with less discomfort.  Strength/MMT:  LE MMT:   R: ER: 4+, IR: 3+, ABD: 4, ADD: 3+, EXT: 3+ L: ER: 4+, IR: 3+, ABD: 4, ADD: 4+, EXT: 3+  Pelvic Floor (from prior session) External Exam: Introitus Appears: WNL Skin  integrity: WNL Palpation: TTP to all external PFM and Mons Cough: not assessed Prolapse visible?: not assessed Scar mobility: Decreased and highly sensitive  Internal Vaginal Exam: Strength (PERF): 4/5 Symmetry: similar B Palpation: highly TTP throughout with greatest at the posterior fourchette. Prolapse: not assessed.   Internal Rectal Exam: Deferred indefinately Strength (PERF): Symmetry: Palpation: Prolapse:  Abdominal:  Pt. Requires MOD to MIN cueing for timing and recruitment of the TA, Obliques, and glutes. **following treatment, ~ 50% recruitment but timing appropriate with VC  only. (from prior visit)  Palpation: TTP to R OI and piriformis  Gait Analysis: Pt. Demonstrates ~ 50% improved eccentric control of foot during loading and ~75% improved hip EXT with push-off on RLE. Continues to demonstrate ~ 50% reduced thoracic rotation ROM with gait.   INTERVENTIONS THIS SESSION: Manual: Performed MMT to hips and assessed hip ER/IR ROM, performed hold-relax lengthening to R hip IR/ER's and TP release to R OI and piriformis to decrease spasm and pain and allow for improved balance of musculature for improved function and decreased symptoms.  Therex:  Reviewed prior visit HEP additions and re-printed and educated on and practiced prone hip EXT to improve compliance and efficacy of HEP and to improve strength of muscles opposing tight musculature to allow reciprocal inhibition to improve balance of musculature surrounding the pelvis and improve overall posture for optimal musculature length-tension relationship and function.   Self-care: Educated Pt. on using crisco and water based lubricant in combination as well as vitamin-E/coconut oil suppositories to improve tissue health of the vagina and continue to decrease pain with intercourse.  Total time: 60 min.                             PT Short Term Goals - 12/24/19 0909      PT SHORT TERM GOAL #1   Title Patient will demonstrate improved pelvic alignment and balance of musculature surrounding the pelvis to facilitate decreased PFM spasms and decrease pelvic pain.    Baseline R up-slip, RLE long, L anterior rotation.    Time 5    Period Weeks    Status Achieved    Target Date 10/08/19      PT SHORT TERM GOAL #2   Title Patient will report no episodes of UUI/SUI over the course of the prior two weeks to demonstrate improved functional ability.    Baseline Pt. having UUI and SUI with sneezing > 3 times in a row. UUI with full bladder emptying    Time 5    Period Weeks    Status Achieved     Target Date 10/08/19      PT SHORT TERM GOAL #3   Title Patient will demonstrate HEP x1 in the clinic and report compliance at least 4/7 days/wk to demonstrate understanding and proper form and committment to allow for further improvement.    Baseline Pt. has improved with Pelvic PT before but has started having Sx. again due to poor HEP compliance.    Time 5    Period Weeks    Status Achieved    Target Date 10/08/19      PT SHORT TERM GOAL #4   Title Pt. will be compliant with stress reduction/ mental health initiatives to down-regulate nervous system and allow for decreased pain and incontinence.    Baseline Pt. is not taking any medication or recieving counselling and is under considerable stress which is contributing to her diverticulitis and  PF Sx.    Time 5    Period Weeks    Status Achieved    Target Date 10/08/19      PT SHORT TERM GOAL #5   Title Be able to clean and stand in line for 1 hr.  without pain > 4/10 to demonstrate improved postural strength and endurance.    Baseline Can do 30 min. of cleaning but pain increases to 8/10    Time 6    Period Weeks    Status New    Target Date 01/31/20             PT Long Term Goals - 12/24/19 0910      PT LONG TERM GOAL #1   Title Pt. will improve in FOTO score by 10 points in each category) to demonstrate improved function.    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    Baseline Pt. having FI ~ 2x/month    Time 10    Period Weeks     Status Achieved    Target Date 11/12/19      PT LONG TERM GOAL #3   Title Patient will be independent with HEP and stress-management routines and have demonstrated compliance for 2 consecutive weeks with a plan for long-term compliance in order to maintain long-term relief of Sx.    Baseline Pt. has demonstrated ~ 80% improvement with PT prior but stopped HEP and had Sx. recurrence.    Time 10    Period Weeks    Status Achieved    Target Date 11/12/19      PT LONG TERM GOAL #4   Title Patient will describe pain no greater than 4/10 during standing/walking for ~ 2 hours to demonstrate improved functional ability.    Baseline Pt. is unable to stand for> 10 min. without pain forcing her to sit down. Cannot clean the house of take grandkids out. As of 10/14: Using a stroller as a walker/support and taking rest breaks (fair, children's museum, etc)    Time 10    Period Weeks    Status Achieved    Target Date 11/12/19      PT LONG TERM GOAL #5   Title Be able to walk for 30 min. straight for exercise without pain increasing more than 4/10 to demonstrate improved function and postural endurance.    Baseline Pain increases to 8/10 with walking continuously without support from a stroller or other AD.    Time 12    Period Weeks    Status New    Target Date 03/13/20                 Plan - 01/28/20 1016    Clinical Impression Statement Pt. Responded well to all interventions today, demonstrating improved glute isolation and recruitment, increased R hip IR/ER ROM, as well as understanding and correct performance of all education and  exercises provided today. They will continue to benefit from skilled physical therapy to work toward remaining goals and maximize function as well as decrease likelihood of symptom increase or recurrence.   PT Next Visit Plan fascial mobility for B HS and R glute region, review standing desk Test DF and hip IR strength, progress hep (consider adding ER/IR  and core strength and spinal mobility, review posterior pelvic tilt in hook-lying, gait-training for foot-drop, Further internal TP release to OI, posterior PR/PC and coccygeus,MFR to L anterior thigh with cupping, TP release To L hip-flexors and R posterior hip PRN. review HEP.    PT Home Exercise Plan diaphragmatic breathing, 3-way wall stretch and seated posture- adjusted for at work treatment, hip-flexor stretch, walking mechanics, butterfly stretch in supine? (pt. says she has handout) and standing hip EXT and ABD, thoracic EXT over towel roll, self TP release with tennis ball, standing leg-swings, psillium, ashwagandah, prebiotic foods, self external PFM release, self posterior fourchette release, posterior pelvic tilt in hook-lying, DF against green band hip EXT and ABD, coconut oil and vit-E suppositories, prone hip EXT .    Consulted and Agree with Plan of Care Patient           Patient will benefit from skilled therapeutic intervention in order to improve the following deficits and impairments:     Visit Diagnosis: Abnormal posture  Other muscle spasm  Sacrococcygeal disorders, not elsewhere classified     Problem List There are no problems to display for this patient.  Cleophus Molt DPT, ATC Cleophus Molt 01/28/2020, 10:17 AM  [place] MAIN [facility] SERVICES [address] [place], [place], [address] Phone: [phone]   Fax:  [phone]  Name: Damarys Speir MRN: [id number] Date of Birth: Sep 27, 1950

## 2020-01-24 NOTE — Patient Instructions (Addendum)
  Use every other day for ~ 2 weeks and then as needed to maintain vaginal tissue health.        Hip Extension:    Lying face down, raise leg just off floor squeezing glutes. Keep leg straight. Hold 1 count. Lower leg to floor. Do _10__ reps per set, __3_ sets for each leg. Place small pillow under abdomen to prevent back from "helping"

## 2020-02-07 ENCOUNTER — Ambulatory Visit: Payer: BC Managed Care – PPO | Attending: Gastroenterology

## 2020-02-07 ENCOUNTER — Other Ambulatory Visit: Payer: Self-pay

## 2020-02-07 DIAGNOSIS — R293 Abnormal posture: Secondary | ICD-10-CM | POA: Insufficient documentation

## 2020-02-07 DIAGNOSIS — M533 Sacrococcygeal disorders, not elsewhere classified: Secondary | ICD-10-CM | POA: Insufficient documentation

## 2020-02-07 DIAGNOSIS — M62838 Other muscle spasm: Secondary | ICD-10-CM | POA: Diagnosis present

## 2020-02-07 NOTE — Therapy (Signed)
[place] MAIN [facility] SERVICES [address] [place], [place], [address] Phone: [phone]   Fax:  [phone]  Physical Therapy Treatment  The patient has been informed of current processes in place at Outpatient Rehab to protect patients from Covid-19 exposure including social distancing, schedule modifications, and new cleaning procedures. After discussing their particular risk with a therapist based on the patient's personal risk factors, the patient has decided to proceed with in-person therapy.  Patient Details  Name: Mallory Weeks MRN: [id number] Date of Birth: 06/19/1950 No data recorded  Encounter Date: 02/07/2020   PT End of Session - 02/07/20 1640    Visit Number 14    Number of Visits 16    Date for PT Re-Evaluation 03/13/20    Authorization Type BCBS    Authorization - Visit Number 14    Authorization - Number of Visits 16    Progress Note Due on Visit 16    PT Start Time 3244    PT Stop Time 1630    PT Time Calculation (min) 60 min    Activity Tolerance Patient tolerated treatment well;No increased pain    Behavior During Therapy WFL for tasks assessed/performed           Past Medical History:  Diagnosis Date  . Asthma   . Hypertension     Past Surgical History:  Procedure Laterality Date  . ABDOMINAL HYSTERECTOMY    . ANTERIOR FUSION CERVICAL SPINE    . CARPAL TUNNEL RELEASE Right   . LAMINECTOMY AND MICRODISCECTOMY LUMBAR SPINE    . LAPAROSCOPY Right    knee  . ROTATOR CUFF REPAIR Right     There were no vitals filed for this visit.   Pelvic Floor Physical Therapy Treatment Note  SCREENING  Changes in medications, allergies, or medical history?: none    SUBJECTIVE  Patient reports: Doing well, was able to go for her whole walk today ~ 30 min. With no increase pain! Still having some pain when trying to get to sleep in the hip/buttock.  Precautions:  Asthma, HTN, diverticulitis  Sexual  activity/pain: Yes, ~10 years, has avoided sex for a long time.  Location of pain: vaginally with intercourse Current pain: 0/10  Max pain: 4/10 Least pain: 0/10 Nature of pain:Sharp and cramping  **not addressed today.  Location of pain: Low back and R posterior Current pain: 0/10  Max pain: 3/10 Least pain: 0/10 Nature of pain:Spasm/achy  **no increased pain following treatment.  Patient Goals: Be able to take the grandkids to the science center without fear of incontinence. To be able to stand long enough to do the dishes and clean up the house without intense pain (~ 4/10).     OBJECTIVE  Changes in: Posture/Observations:  RLE long in supine and seated (appropriate since RLE is long).   RLE externally rotated in standing PSIS level with heel-lift in place  Range of Motion/Flexibilty:  Decreased sacral mobility at R lateral border near base with pain.  R 90/90: -45 deg. L 90/90: -40 deg  R hip ER/IR: 45/20 L hip ER/IR: 60/30  ** increased  Hip flexion ROM by ~ 20 deg. B following treatment.  Strength/MMT:  LE MMT:   R: ER: 4+, IR: 3+, ABD: 4, ADD: 3+, EXT: 3+ L: ER: 4+, IR: 3+, ABD: 4, ADD: 4+, EXT: 3+  Pelvic Floor (from prior session) External Exam: Introitus Appears: WNL Skin integrity: WNL Palpation: TTP to all external PFM and Mons Cough:  not assessed Prolapse visible?: not assessed Scar mobility: Decreased and highly sensitive  Internal Vaginal Exam: Strength (PERF): 4/5 Symmetry: similar B Palpation: highly TTP throughout with greatest at the posterior fourchette. Prolapse: not assessed.   Internal Rectal Exam: Deferred indefinately Strength (PERF): Symmetry: Palpation: Prolapse:  Abdominal:  Pt. Requires MOD to MIN cueing for timing and recruitment of the TA, Obliques, and glutes. **following treatment, ~ 50% recruitment but timing appropriate with VC only. (from prior visit)  Palpation: TTP to B lateral gastroc and  FDL  Gait Analysis: Pt. Demonstrates ~ 50% improved eccentric control of foot during loading and ~75% improved hip EXT with push-off on RLE. Continues to demonstrate ~ 50% reduced thoracic rotation ROM with gait. (from prior session)   INTERVENTIONS THIS SESSION: Manual: Performed TP release to B lateral gastroc and FDL as well as MFR to B posterior thighs and L medial ankle as well as R posterior hip/SIJ to decrease spasm and pain and allow for improved balance of musculature and fascial tension for improved function and decreased symptoms.  Therex:  Reviewed seated HS stretch and standing calf-stretch To maintain and improve muscle length and allow for improved balance of musculature for long-term symptom relief.  Total time: 60 min.                             PT Short Term Goals - 12/24/19 0909      PT SHORT TERM GOAL #1   Title Patient will demonstrate improved pelvic alignment and balance of musculature surrounding the pelvis to facilitate decreased PFM spasms and decrease pelvic pain.    Baseline R up-slip, RLE long, L anterior rotation.    Time 5    Period Weeks    Status Achieved    Target Date 10/08/19      PT SHORT TERM GOAL #2   Title Patient will report no episodes of UUI/SUI over the course of the prior two weeks to demonstrate improved functional ability.    Baseline Pt. having UUI and SUI with sneezing > 3 times in a row. UUI with full bladder emptying    Time 5    Period Weeks    Status Achieved    Target Date 10/08/19      PT SHORT TERM GOAL #3   Title Patient will demonstrate HEP x1 in the clinic and report compliance at least 4/7 days/wk to demonstrate understanding and proper form and committment to allow for further improvement.    Baseline Pt. has improved with Pelvic PT before but has started having Sx. again due to poor HEP compliance.    Time 5    Period Weeks    Status Achieved    Target Date 10/08/19      PT SHORT TERM GOAL  #4   Title Pt. will be compliant with stress reduction/ mental health initiatives to down-regulate nervous system and allow for decreased pain and incontinence.    Baseline Pt. is not taking any medication or recieving counselling and is under considerable stress which is contributing to her diverticulitis and PF Sx.    Time 5    Period Weeks    Status Achieved    Target Date 10/08/19      PT SHORT TERM GOAL #5   Title Be able to clean and stand in line for 1 hr.  without pain > 4/10 to demonstrate improved postural strength and endurance.    Baseline Can do  30 min. of cleaning but pain increases to 8/10    Time 6    Period Weeks    Status New    Target Date 01/31/20             PT Long Term Goals - 12/24/19 0910      PT LONG TERM GOAL #1   Title Pt. will improve in FOTO score by 10 points in each category) to demonstrate improved function.    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    Baseline Pt. having FI ~ 2x/month    Time 10    Period Weeks    Status Achieved    Target Date 11/12/19      PT LONG TERM GOAL #3   Title Patient will be independent with HEP and stress-management routines and have demonstrated compliance for 2 consecutive weeks with a plan for long-term compliance in order to maintain long-term relief of Sx.    Baseline Pt. has demonstrated ~ 80% improvement with PT prior but stopped HEP and had Sx. recurrence.    Time 10    Period Weeks    Status Achieved    Target  Date 11/12/19      PT LONG TERM GOAL #4   Title Patient will describe pain no greater than 4/10 during standing/walking for ~ 2 hours to demonstrate improved functional ability.    Baseline Pt. is unable to stand for> 10 min. without pain forcing her to sit down. Cannot clean the house of take grandkids out. As of 10/14: Using a stroller as a walker/support and taking rest breaks (fair, children's museum, etc)    Time 10    Period Weeks    Status Achieved    Target Date 11/12/19      PT LONG TERM GOAL #5   Title Be able to walk for 30 min. straight for exercise without pain increasing more than 4/10 to demonstrate improved function and postural endurance.    Baseline Pain increases to 8/10 with walking continuously without support from a stroller or other AD.    Time 12    Period Weeks    Status New    Target Date 03/13/20                 Plan - 02/07/20 1641    Clinical Impression Statement Pt. Responded well to all interventions today, demonstrating improved fascial mobility, hip flexion and DF ROM, and decreased spasm, as well as understanding and correct performance of all education and exercises provided today. She has met her goal of being able to walk 30 min. Without increased pain and will likely D/C at next visit if Sx. Remain steady. They will continue to benefit from skilled physical therapy to work toward remaining goals and maximize function as well as decrease likelihood of symptom increase or recurrence.    PT Next Visit Plan re-assess  goals and D/C if appropriate. Review standing desk Test DF and hip IR strength, progress hep (consider adding ER/IR and core strength and spinal mobility, review posterior pelvic tilt in hook-lying, gait-training for foot-drop, Further internal TP release to OI, posterior PR/PC and coccygeus,MFR to L anterior thigh with cupping, TP release To L hip-flexors and R posterior hip PRN. review HEP.    PT Home Exercise Plan diaphragmatic  breathing, 3-way wall stretch and seated posture- adjusted for at work treatment, hip-flexor stretch, walking mechanics, butterfly stretch in supine? (pt. says she has handout) and standing hip EXT and ABD, thoracic EXT over towel roll, self TP release with tennis ball, standing leg-swings, psillium, ashwagandah, prebiotic foods, self external PFM release, self posterior fourchette release, posterior pelvic tilt in hook-lying, DF against green band hip EXT and ABD, coconut oil and vit-E suppositories, prone hip EXT, calf stretch.    Consulted and Agree with Plan of Care Patient           Patient will benefit from skilled therapeutic intervention in order to improve the following deficits and impairments:     Visit Diagnosis: Abnormal posture  Other muscle spasm  Sacrococcygeal disorders, not elsewhere classified     Problem List There are no problems to display for this patient.  Willa Rough DPT, ATC Willa Rough 02/07/2020, 4:46 PM  [place] MAIN [facility] SERVICES [address] [place], [place], [address] Phone: [phone]   Fax:  [phone]  Name: Mallory Weeks MRN: [id number] Date of Birth: 04/29/1950

## 2020-02-07 NOTE — Patient Instructions (Signed)
  Bring your toe up high on the wall and then shift your hips toward the wall until you feel a stretch in the calf. Hold for 30 sec. (5 deep breaths.) And repeat 2-3 times on each side.

## 2020-02-21 ENCOUNTER — Other Ambulatory Visit: Payer: Self-pay

## 2020-02-21 ENCOUNTER — Ambulatory Visit: Payer: BC Managed Care – PPO

## 2020-02-21 DIAGNOSIS — M62838 Other muscle spasm: Secondary | ICD-10-CM

## 2020-02-21 DIAGNOSIS — M533 Sacrococcygeal disorders, not elsewhere classified: Secondary | ICD-10-CM

## 2020-02-21 DIAGNOSIS — R293 Abnormal posture: Secondary | ICD-10-CM | POA: Diagnosis not present

## 2020-02-21 NOTE — Therapy (Signed)
[place] MAIN [facility] SERVICES [address] [place], [place], [address] Phone: [phone]   Fax:  [phone]  Physical Therapy Treatment and Discharge Summary  The patient has been informed of current processes in place at Outpatient Rehab to protect patients from Covid-19 exposure including social distancing, schedule modifications, and new cleaning procedures. After discussing their particular risk with a therapist based on the patient's personal risk factors, the patient has decided to proceed with in-person therapy.  Patient Details  Name: Mallory Weeks MRN: [id number] Date of Birth: 1950-07-16 No data recorded  Encounter Date: 02/21/2020   PT End of Session - 02/21/20 1649    Visit Number 15    Number of Visits 16    Date for PT Re-Evaluation 03/13/20    Authorization Type [employer]    Authorization - Visit Number 15    Authorization - Number of Visits 16    Progress Note Due on Visit 16    PT Start Time 1640    PT Stop Time 1730    PT Time Calculation (min) 50 min    Activity Tolerance Patient tolerated treatment well;No increased pain    Behavior During Therapy WFL for tasks assessed/performed           Past Medical History:  Diagnosis Date   Asthma    Hypertension     Past Surgical History:  Procedure Laterality Date   ABDOMINAL HYSTERECTOMY     ANTERIOR FUSION CERVICAL SPINE     CARPAL TUNNEL RELEASE Right    LAMINECTOMY AND MICRODISCECTOMY LUMBAR SPINE     LAPAROSCOPY Right    knee   ROTATOR CUFF REPAIR Right     There were no vitals filed for this visit.   Pelvic Floor Physical Therapy Treatment Note and Discharge Summary  SCREENING  Changes in medications, allergies, or medical history?: none    SUBJECTIVE  Patient reports: She was at her Granddaughter's house over the weekend and was not good about keeping up with her exercises and learned that if she does not do them, the pain and weakness  return but she was able to get it back under control when she got back on schedule with her exercises. Otherwise things are going well.  Precautions:  Asthma, HTN, diverticulitis  Sexual activity/pain: Yes, ~10 years, has avoided sex for a long time.  Location of pain: vaginally with intercourse Current pain: 0/10  Max pain: 4/10 Least pain: 0/10 Nature of pain:Sharp and cramping  **not addressed today.  Location of pain: Low back and R posterior Current pain: 0/10  Max pain: 4/10 Least pain: 0/10 Nature of pain:Spasm/achy  **no increased pain following treatment.  Patient Goals: Be able to take the grandkids to the science center without fear of incontinence. To be able to stand long enough to do the dishes and clean up the house without intense pain (~ 4/10).     OBJECTIVE  Changes in: Posture/Observations:  RLE long in supine and seated (appropriate since RLE is long).   PSIS level in standing with heel-lift in place  Range of Motion/Flexibilty:  Decreased sacral mobility at R lateral border near base with pain.  R 90/90: -45 deg. L 90/90: -40 deg  R hip ER/IR: 45/20 L hip ER/IR: 60/30  ** increased  Hip flexion ROM by ~ 20 deg. B following treatment. (from previous session)  Strength/MMT:  LE MMT:   R: ER: 4+, IR: 3+, ABD: 4, ADD: 3+, EXT: 3+ L: ER:  4+, IR: 3+, ABD: 4, ADD: 4+, EXT: 3+  Pelvic Floor (from prior session) External Exam: Introitus Appears: WNL Skin integrity: WNL Palpation: TTP to all external PFM and Mons Cough: not assessed Prolapse visible?: not assessed Scar mobility: Decreased and highly sensitive  Internal Vaginal Exam: Strength (PERF): 4/5 Symmetry: similar B Palpation: highly TTP throughout with greatest at the posterior fourchette. Prolapse: not assessed.   Internal Rectal Exam: Deferred indefinately Strength (PERF): Symmetry: Palpation: Prolapse:  Abdominal:  Pt. Requires MOD to MIN cueing for timing  and recruitment of the TA, Obliques, and glutes. **following treatment, ~ 50% recruitment but timing appropriate with VC only. (from prior visit)  Palpation: TTP to B lateral gastroc and L foot intrinsics in mid-foot.  Gait Analysis: Pt. Demonstrates ~ 50% improved eccentric control of foot during loading and ~75% improved hip EXT with push-off on RLE. Continues to demonstrate ~ 50% reduced thoracic rotation ROM with gait. (from prior session)     INTERVENTIONS THIS SESSION: Manual:  Performed TP release to L gastroc and intrinsic foot musculature to decrease spasm and pain and allow for improved balance of musculature for improved function and decreased symptoms.  Therex: reviewed HEP and how to progress/maintain improvement through it. Educated on stretching calf,HS, quads, and hip-flexors and LB after walking and on using a tennis bal and rubber ball tohow to perform self TP release to her calves and intrinsic foot muscles to decrease plantar fasciitis.  Self-care: Reviewed goals and discussed ergonomics with an adjustable stand/sitting desk and standing for ~ 15 min. Per hour.   Total time: 60 min.                                 PT Short Term Goals - 02/21/20 1652      PT SHORT TERM GOAL #1   Title Patient will demonstrate improved pelvic alignment and balance of musculature surrounding the pelvis to facilitate decreased PFM spasms and decrease pelvic pain.    Baseline R up-slip, RLE long, L anterior rotation.    Time 5    Period Weeks    Status Achieved    Target Date 10/08/19      PT SHORT TERM GOAL #2   Title Patient will report no episodes of UUI/SUI over the course of the prior two weeks to demonstrate improved functional ability.    Baseline Pt. having UUI and SUI with sneezing > 3 times in a row. UUI with full bladder emptying    Time 5    Period Weeks    Status Achieved    Target Date 10/08/19      PT SHORT TERM GOAL #3   Title Patient  will demonstrate HEP x1 in the clinic and report compliance at least 4/7 days/wk to demonstrate understanding and proper form and committment to allow for further improvement.    Baseline Pt. has improved with Pelvic PT before but has started having Sx. again due to poor HEP compliance.    Time 5    Period Weeks    Status Achieved    Target Date 10/08/19      PT SHORT TERM GOAL #4   Title Pt. will be compliant with stress reduction/ mental health initiatives to down-regulate nervous system and allow for decreased pain and incontinence.    Baseline Pt. is not taking any medication or recieving counselling and is under considerable stress which is contributing to her diverticulitis and  PF Sx.    Time 5    Period Weeks    Status Achieved    Target Date 10/08/19      PT SHORT TERM GOAL #5   Title Be able to clean and stand in line for 1 hr.  without pain > 4/10 to demonstrate improved postural strength and endurance.    Baseline Can do 30 min. of cleaning but pain increases to 8/10    Time 6    Period Weeks    Status Achieved    Target Date 01/31/20             PT Long Term Goals - 02/21/20 1645      PT LONG TERM GOAL #1   Title Pt. will improve in FOTO score by 10 points in each category) to demonstrate improved function.    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    Baseline Pt. having FI ~  2x/month    Time 10    Period Weeks    Status Achieved    Target Date 11/12/19      PT LONG TERM GOAL #3   Title Patient will be independent with HEP and stress-management routines and have demonstrated compliance for 2 consecutive weeks with a plan for long-term compliance in order to maintain long-term relief of Sx.    Baseline Pt. has demonstrated ~ 80% improvement with PT prior but stopped HEP and had Sx. recurrence.    Time 10    Period Weeks    Status Achieved    Target Date 11/12/19      PT LONG TERM GOAL #4   Title Patient will describe pain no greater than 4/10 during standing/walking for ~ 2 hours to demonstrate improved functional ability.    Baseline Pt. is unable to stand for> 10 min. without pain forcing her to sit down. Cannot clean the house of take grandkids out. As of 10/14: Using a stroller as a walker/support and taking rest breaks (fair, children's museum, etc)    Time 10    Period Weeks    Status Achieved    Target Date 11/12/19      PT LONG TERM GOAL #5   Title Be able to walk for 30 min. straight for exercise without pain increasing more than 4/10 to demonstrate improved function and postural endurance.    Baseline Pain increases to 8/10 with walking continuously without support from a stroller or other AD.    Time 12    Period Weeks    Status Achieved    Target Date 03/13/20                 Plan - 02/22/20 1615    Clinical Impression Statement Pt. has met all goals and demonstrates full understanding of her HEP and other  ways she can help precent return of Sx. We will D/C at this time to her HEP.    PT Next Visit Plan D/C    PT Home Exercise Plan diaphragmatic breathing, 3-way wall stretch and seated posture- adjusted for at work treatment, hip-flexor stretch, walking mechanics, butterfly stretch in supine? (pt. says she has handout) and standing hip EXT and ABD, thoracic EXT over towel roll, self TP release with tennis ball, standing leg-swings,  psillium, ashwagandah, prebiotic foods, self external PFM release, self posterior fourchette release, posterior pelvic tilt in hook-lying, DF against green band hip EXT and ABD, coconut oil and vit-E suppositories, prone hip EXT, calf stretch, self plantar fasciitis release and calf release.    Consulted and Agree with Plan of Care Patient           Patient will benefit from skilled therapeutic intervention in order to improve the following deficits and impairments:     Visit Diagnosis: Abnormal posture  Other muscle spasm  Sacrococcygeal disorders, not elsewhere classified     Problem List There are no problems to display for this patient.  Willa Rough DPT, ATC Willa Rough 02/22/2020, 4:25 PM  [place] MAIN [facility] SERVICES [address] [place], [place], [address] Phone: [phone]   Fax:  [phone]  Name: Mallory Weeks MRN: [id number] Date of Birth: 1950/06/05

## 2020-06-02 ENCOUNTER — Other Ambulatory Visit: Payer: Self-pay | Admitting: Gastroenterology

## 2020-07-21 ENCOUNTER — Other Ambulatory Visit: Payer: Self-pay

## 2020-07-21 DIAGNOSIS — R928 Other abnormal and inconclusive findings on diagnostic imaging of breast: Secondary | ICD-10-CM

## 2020-07-31 ENCOUNTER — Other Ambulatory Visit: Payer: Self-pay

## 2020-07-31 ENCOUNTER — Ambulatory Visit
Admission: RE | Admit: 2020-07-31 | Discharge: 2020-07-31 | Disposition: A | Discharge status: Home / Self Care | Payer: BC Managed Care – PPO | Source: Ambulatory Visit

## 2020-07-31 DIAGNOSIS — R928 Other abnormal and inconclusive findings on diagnostic imaging of breast: Secondary | ICD-10-CM

## 2021-08-31 IMAGING — MG DIGITAL DIAGNOSTIC BILAT W/ TOMO W/ CAD
8 series · 8 of 24 positions shown · non-contrast
Comparison: Previous exam(s).

CLINICAL DATA: Patient presents for bilateral diagnostic
examination to follow-up a probable benign 4 mm mass at the [DATE]
position of the left breast. Patient has been lost to follow-up
since 8957.

EXAM:
DIGITAL DIAGNOSTIC BILATERAL MAMMOGRAM WITH TOMOSYNTHESIS AND CAD
TECHNIQUE: Bilateral digital diagnostic mammography and breast tomosynthesis
was performed. The images were evaluated with computer-aided
detection.

[R MLO synth-2D]
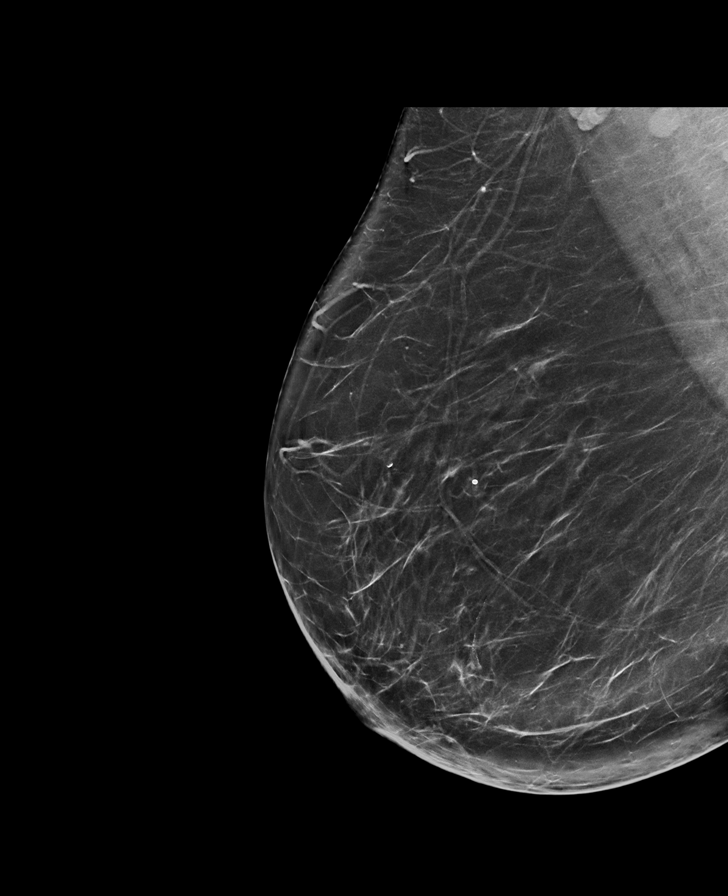

[L CC synth-2D]
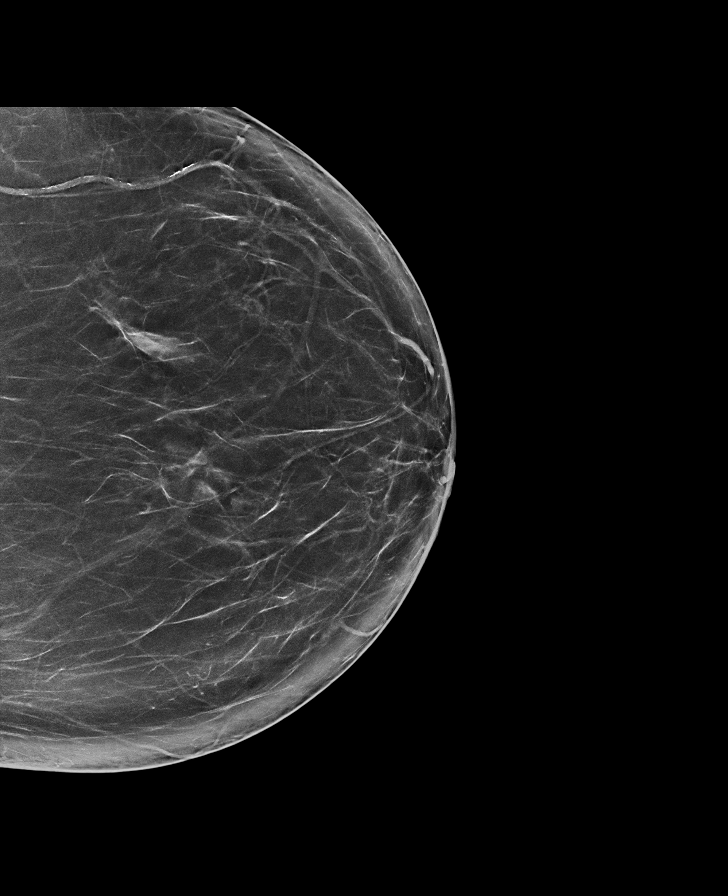

[L MLO synth-2D]
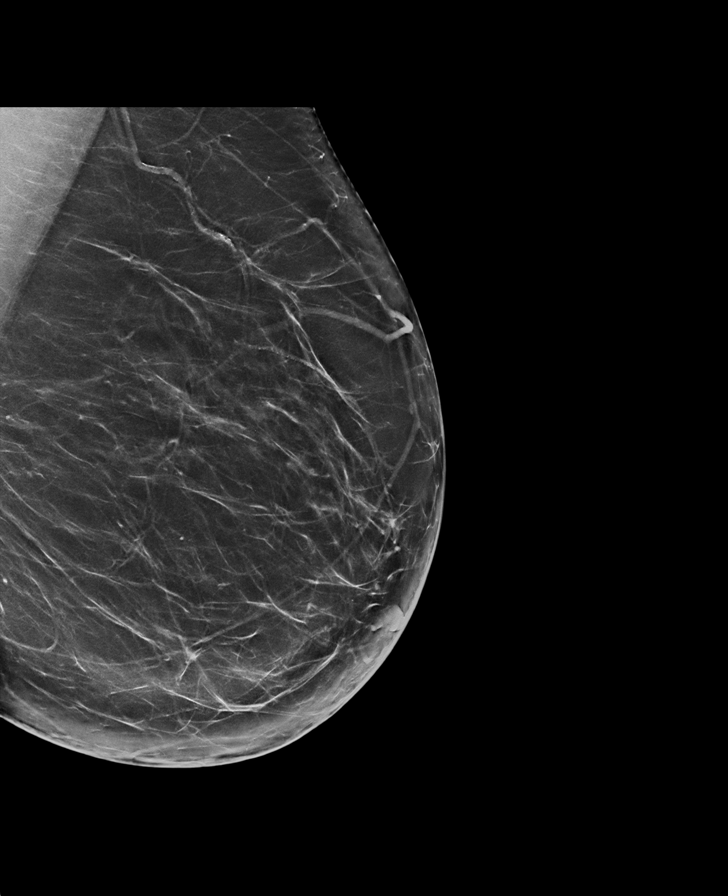

[R CC synth-2D]
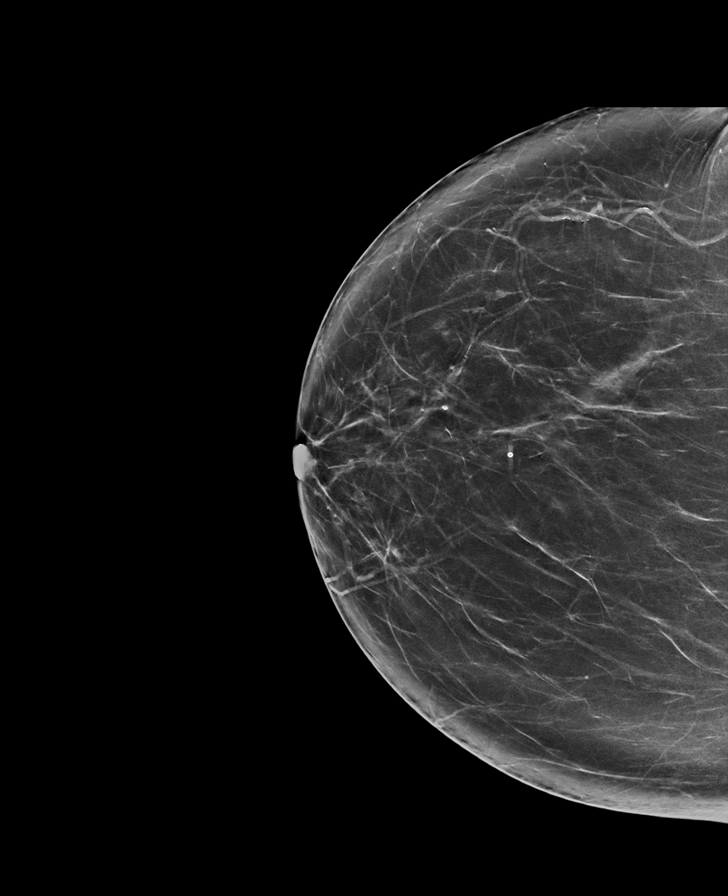

[R MLO tomo · tomo slice 43/84.0]
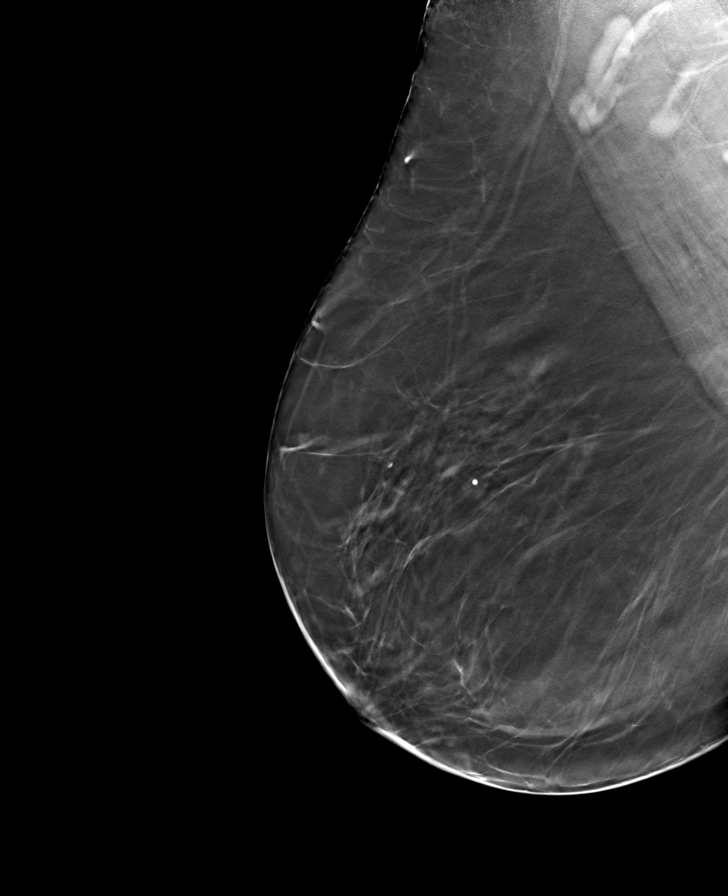

[L MLO tomo · tomo slice 43/85.0]
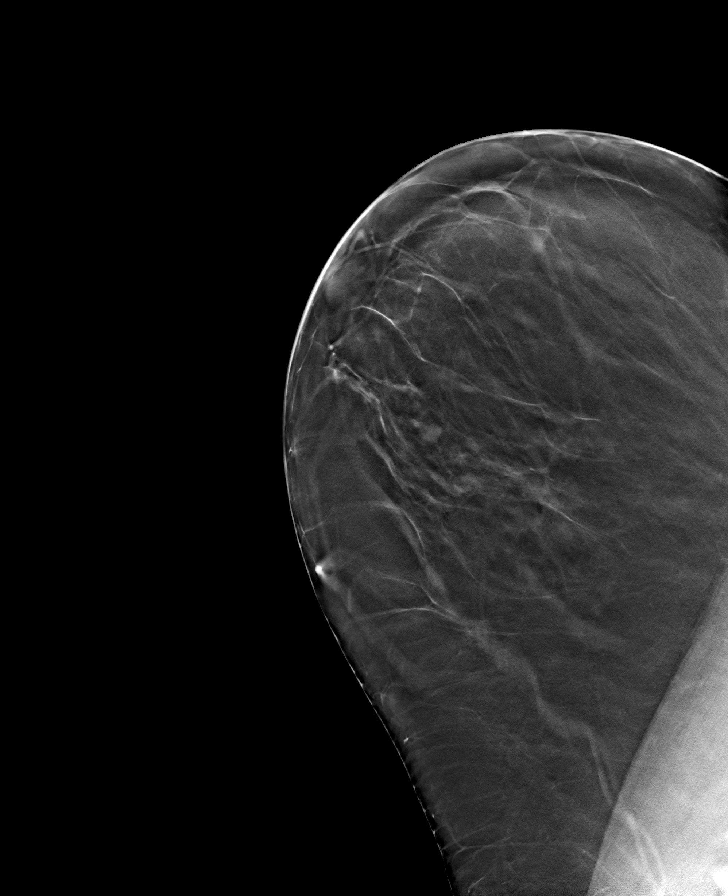

[R CC tomo · tomo slice 41/81.0]
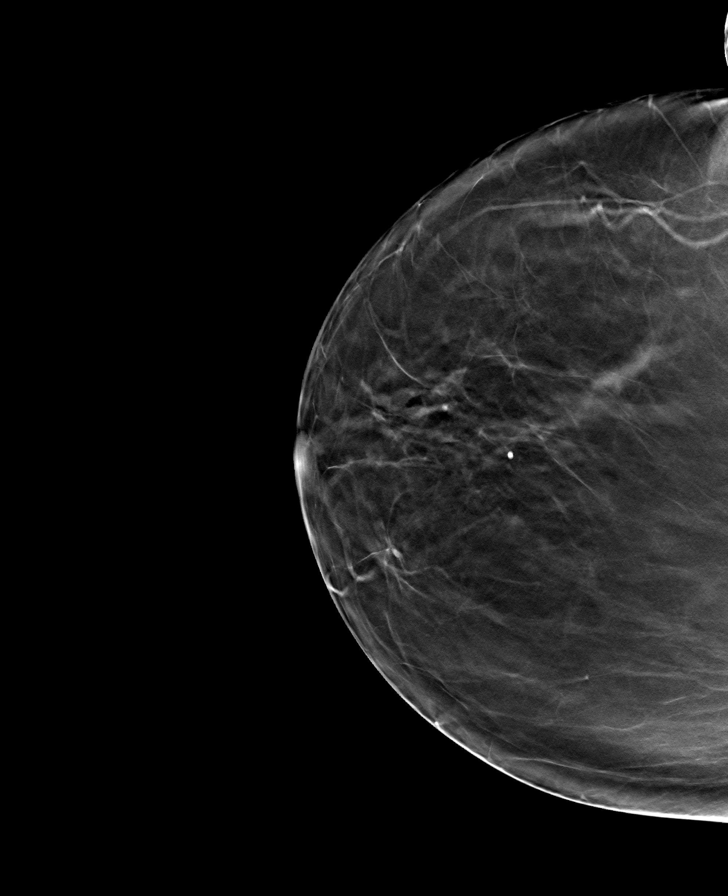

[L CC tomo · tomo slice 42/83.0]
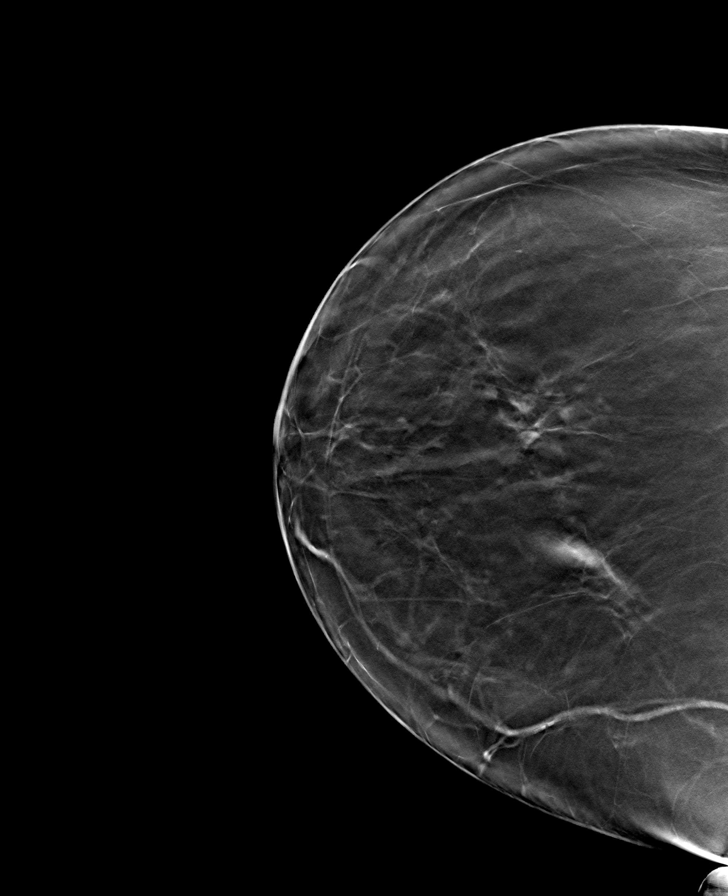

[8 of 24 positions shown; findings below may reference images not displayed]

ACR Breast Density Category b: There are scattered areas of
fibroglandular density.
FINDINGS: Examination demonstrates no change in a 3-4 mm oval circumscribed
mass over the middle third of the inner upper left breast. This has
been stable since 8957 is therefore considered benign. Remainder of
the left breast as well as the right breast is unchanged.
IMPRESSION: Stable 4 mm mass over the inner upper left breast. This has been
stable since 8957 and is therefore considered benign.

RECOMMENDATION:
Recommend continued annual bilateral screening mammographic
follow-up.

I have discussed the findings and recommendations with the patient.
If applicable, a reminder letter will be sent to the patient
regarding the next appointment.

BI-RADS CATEGORY  2: Benign.

## 2021-09-09 ENCOUNTER — Other Ambulatory Visit: Payer: Self-pay | Admitting: Family Medicine

## 2021-09-09 DIAGNOSIS — Z1231 Encounter for screening mammogram for malignant neoplasm of breast: Secondary | ICD-10-CM

## 2021-09-29 ENCOUNTER — Ambulatory Visit: Payer: BC Managed Care – PPO

## 2021-10-15 ENCOUNTER — Ambulatory Visit: Payer: BC Managed Care – PPO

## 2021-11-05 ENCOUNTER — Ambulatory Visit
Admission: RE | Admit: 2021-11-05 | Discharge: 2021-11-05 | Disposition: A | Discharge status: Home / Self Care | Payer: BC Managed Care – PPO | Source: Ambulatory Visit | Attending: Family Medicine | Admitting: Family Medicine

## 2021-11-05 DIAGNOSIS — Z1231 Encounter for screening mammogram for malignant neoplasm of breast: Secondary | ICD-10-CM | POA: Insufficient documentation
